# Patient Record
Sex: Female | Born: 1985 | Hispanic: No | Marital: Single | Smoking: Never smoker
Health system: Southern US, Community
[De-identification: ages and names within clinical notes are randomized; demographics above are authoritative.]

## PROBLEM LIST (undated history)

## (undated) DIAGNOSIS — R569 Unspecified convulsions: Secondary | ICD-10-CM

## (undated) HISTORY — DX: Unspecified convulsions: R56.9

---
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
## 2011-11-19 DIAGNOSIS — R339 Retention of urine, unspecified: Secondary | ICD-10-CM | POA: Insufficient documentation

## 2016-07-11 ENCOUNTER — Encounter (INDEPENDENT_AMBULATORY_CARE_PROVIDER_SITE_OTHER): Payer: Self-pay

## 2016-07-11 DIAGNOSIS — M722 Plantar fascial fibromatosis: Secondary | ICD-10-CM | POA: Insufficient documentation

## 2016-07-11 DIAGNOSIS — N319 Neuromuscular dysfunction of bladder, unspecified: Secondary | ICD-10-CM | POA: Insufficient documentation

## 2016-09-16 ENCOUNTER — Other Ambulatory Visit (INDEPENDENT_AMBULATORY_CARE_PROVIDER_SITE_OTHER): Payer: Self-pay | Admitting: Sports Medicine

## 2016-09-16 NOTE — Telephone Encounter (Signed)
Rx was called in to pharmacy. 

## 2016-09-16 NOTE — Telephone Encounter (Signed)
Please see refill request.

## 2016-09-28 ENCOUNTER — Ambulatory Visit (INDEPENDENT_AMBULATORY_CARE_PROVIDER_SITE_OTHER): Payer: Self-pay | Admitting: Sports Medicine

## 2016-10-04 ENCOUNTER — Other Ambulatory Visit: Payer: Self-pay | Admitting: Sports Medicine

## 2016-10-05 ENCOUNTER — Telehealth (INDEPENDENT_AMBULATORY_CARE_PROVIDER_SITE_OTHER): Payer: Self-pay | Admitting: Sports Medicine

## 2016-10-05 NOTE — Telephone Encounter (Signed)
Rx refill request

## 2016-10-05 NOTE — Telephone Encounter (Signed)
[place]

## 2016-10-05 NOTE — Telephone Encounter (Signed)
Pt called for refill of tramadol. Pt callback is [phone].

## 2016-10-05 NOTE — Telephone Encounter (Signed)
Patient would a Rx refill for Tramadol?

## 2016-10-05 NOTE — Telephone Encounter (Signed)
Please call into pharmacy

## 2016-10-06 ENCOUNTER — Telehealth (INDEPENDENT_AMBULATORY_CARE_PROVIDER_SITE_OTHER): Payer: Self-pay | Admitting: Sports Medicine

## 2016-10-06 NOTE — Telephone Encounter (Signed)
Patient Rx for tramadol was called into [facility] on [address] [place], [place] per patient request due to [facility] being down.

## 2016-10-06 NOTE — Telephone Encounter (Signed)
Pt. Called stating [facility] is [facility], and [facility] to [facility] [address] [place].

## 2016-10-06 NOTE — Telephone Encounter (Signed)
Patient advised  she called the pharmacy and her Rx is not there yet. Patient asked if she can get the Rx filled earlier. Patient said the difference is the Rx is it will be 7 days early.  The number to contact her is [phone]

## 2016-10-07 ENCOUNTER — Telehealth (INDEPENDENT_AMBULATORY_CARE_PROVIDER_SITE_OTHER): Payer: Self-pay | Admitting: Sports Medicine

## 2016-10-07 NOTE — Telephone Encounter (Signed)
Patient called asked if the direction on the medication can be changed so that the Rx can be filled. The Rx is early so the pharmacy will not fill it.   Patient asked for a call back as soon as possible. The pharmacy said the insurance will pay for the prescription if the direction is changed so that she can pick Rx up early.  The number to contact her is [phone]

## 2016-10-07 NOTE — Telephone Encounter (Signed)
Rx was called into pharmacy. Patient  Stated to call [facility] due to [facility] system being down.

## 2016-10-07 NOTE — Telephone Encounter (Signed)
Patient no showed her last appointment. Unfortunately she is maxed out on tramadol dosing. I do not prescribe it more often than every 6 hours. If she has run out early unfortunately there is no way to change the prescription to higher dose at this time.

## 2016-10-07 NOTE — Telephone Encounter (Signed)
See message below concerning Rx direction's. Please Advise. Thank You

## 2016-10-08 NOTE — Telephone Encounter (Signed)
Talked with patient and advised her of Dr. Berline Choughigby message.  Patient stated that she has an appointment  Scheduled for next week.

## 2016-10-12 ENCOUNTER — Ambulatory Visit (INDEPENDENT_AMBULATORY_CARE_PROVIDER_SITE_OTHER): Payer: Self-pay | Admitting: Sports Medicine

## 2016-10-14 ENCOUNTER — Encounter (INDEPENDENT_AMBULATORY_CARE_PROVIDER_SITE_OTHER): Payer: Self-pay | Admitting: Sports Medicine

## 2016-10-14 ENCOUNTER — Ambulatory Visit (INDEPENDENT_AMBULATORY_CARE_PROVIDER_SITE_OTHER): Payer: Self-pay

## 2016-10-14 ENCOUNTER — Ambulatory Visit (INDEPENDENT_AMBULATORY_CARE_PROVIDER_SITE_OTHER): Payer: Self-pay | Admitting: Sports Medicine

## 2016-10-14 VITALS — BP 106/70 | HR 100 | Ht 65.0 in | Wt 230.0 lb

## 2016-10-14 DIAGNOSIS — M722 Plantar fascial fibromatosis: Secondary | ICD-10-CM

## 2016-10-14 DIAGNOSIS — N319 Neuromuscular dysfunction of bladder, unspecified: Secondary | ICD-10-CM

## 2016-10-14 DIAGNOSIS — M79605 Pain in left leg: Secondary | ICD-10-CM

## 2016-10-14 NOTE — Progress Notes (Signed)
Mary Mccullough - 30 y.o. female MRN [id number]  Date of birth: 09/29/1986  Office Visit Note: Visit Date: 10/14/2016 PCP: Delorse LekBlair, Diane W, FNP Referred by: No ref. provider found  Subjective: Chief Complaint  Patient presents with  . Left Foot - Follow-up  . Follow-up    Patient states left foot is about the same.  Having trouble with left foot at night time.   HPI: Persistent left-sided foot pain that is essentially unchanged although she does seem to ambulate more comfortably at this time. Swelling is less pronounced than in the past but persistently bothersome especially while ambulating. She denies any pain in the posterior calf or focally over the calcaneus. No significant back pain. The pain does continue to keep her awake at night. She does have chronic underlying urinary issues including an indwelling bladder stimulator.  Additionally she reports that she has had issues with seizure-like activity that has worsened over the past several years & she is working on trying to get scheduled with neurology. She reports absent-like episodes. One of these has occurred while driving.. ROS: She denies any fevers chills recent unexpected weight gain or weight loss.  Some nighttime disturbances due to this issue. Otherwise per HPI.   Clinical History: No specialty comments available.  She reports that she has never smoked. She has never used smokeless tobacco.  No results for input(s): HGBA1C, LABURIC in the last 8760 hours.  Assessment & Plan: Visit Diagnoses:  1. Pain in left leg   2. Plantar fasciitis   3. Neurogenic dysfunction of the urinary bladder    Plan: Given the persistent unrelenting pain that is actually worsened following plantar fascial injection I am concerned that there may be a central process which I suspect is related to her underlying bladder dysfunction. X-rays of her back today do appear to be normal. At this time I recommend a referral to neurology for consideration of  lower extremity nerve conduction studies as well as evaluation for's seizure-like activity. Ultimately she cannot have an MRI due to the indwelling bladder stimulator.  The tramadol that she has been taking has been providing moderate relief & she denies any associated increases in the seizure-like activity since starting this medicine but I did recommend trying to discontinue this due to the risk of lowering her seizure threshold. Also consider Zofran contribution to episodes. >50% of this 25 minute visit spent in direct patient counseling and/or coordination of care. Discussion was focused on education regarding the in discussing the pathoetiology and anticipated clinical course of the above condition.  Follow-up: We'll plan to follow up with her if any lack of improvement from neurology. Overall her plantar fascia & Achilles are less likely to be causing this discomfort   Meds: No orders of the defined types were placed in this encounter.  Orders:  Orders Placed This Encounter  Procedures  . XR Lumbar Spine 2-3 Views  . Ambulatory referral to Neurology   Procedures: No notes on file   Objective:  VS:  HT:5\' 5"  (165.1 cm)   WT:230 lb (104.3 kg)  BMI:38.4    BP:106/70  HR:100bpm  TEMP: ( )  RESP:  Physical Exam: Adult female. In no acute distress. Alert and appropriate. Left foot: Minimal pain with straight leg raise. Circumference symmetric today with 1+ pitting edema in the left & trace in the right. DP & PT pulses are 1+/4 & are slightly diminished on the left compared to right. Sensation in the left lower extremity is  altered compared to the right in a nondermatomal distribution. Babinski is equivocal on the left & upward going on the right right. DTRs absent bilaterally & symmetrically. Imaging: Xr Lumbar Spine 2-3 Views  Result Date: 10/18/2016 Findings: 2V lumbar spine. Overall good alignment. No scoliosis. Bladder stimulator in place. Somewhat exaggerated lumbar lordosis. Minimal  osteophytic spurring with well maintained joint space throughout the lumbar spine. Mild facet arthrosis. Impression: Normal x-ray    Past Medical/Family/Surgical/Social History: Medications & Allergies reviewed per EMR Patient Active Problem List   Diagnosis Date Noted  . Neurogenic dysfunction of the urinary bladder 07/11/2016  . Plantar fasciitis 07/11/2016  . Urinary retention with incomplete bladder emptying 11/19/2011   No past medical history on file. Family History  Problem Relation Age of Onset  . Family history unknown: Yes   No past surgical history on file. Social History   Occupational History  . Not on file.   Social History Main Topics  . Smoking status: Never Smoker  . Smokeless tobacco: Never Used  . Alcohol use Not on file  . Drug use: Unknown  . Sexual activity: Not on file

## 2016-10-14 NOTE — Patient Instructions (Addendum)
Sleep is an integral part of our bodies ability to recover from our daily activities and is key to making you body perform at its maximal potential.  Establishing and maintaining a healthy sleep pattern can not only make you feel better it can help many chronic illnesses including high blood pressure, high cholesterol, obesity, chronic pain syndromes, and many others.  Some key things to remember regarding sleep are:  Establish a consistent nightly routine that you do each night before bed.  Avoid caffeine, tobacco and alcohol as all of these drugs will cause sleep disturbances.    Establish an exercise routine.  This can be as simple as walking, dancing or what every you find gets your heart rate elevated to the point you can speak in only 3-4 word sentences.  Exercise will help make falling and staying asleep easier.    If you have difficulty with sleep, reserve the bedroom for sleeping; do not watch TV, read, eat or exercise in your bed room.      - Watching TV in bed can trick your brain into thinking it is day time and will reset your internal clock.  If you are going to watch TV before bed do so outside of the bedroom.  Although it is best to avoid screens for the hour prior to bed that is difficult to do in our current technology driven society - at the very least it is imperative that you remove TV from the bed room.      - If you have a hard time falling asleep avoid showering and exercising prior to bed.      I am transferring practices as of January 1st  to [facility] at [facility].  This is a great opportunity & I am saddened to be leaving [facility] however & excited for new opportunities. I will continue to be seeing patients at [facility] through the end of December. I am happy to see you at the new location but also am confident that you are in great hands with the excellent providers here at [facility].  We are not currently  scheduling patients at the new location at this time but if you look on [facility] website a contact information should be available there closer to January. Additionally [email or web address] will have information when it becomes available.    The telephone number will be [phone]  - Nobody will be answering this phone number until closer to January.

## 2016-10-19 ENCOUNTER — Other Ambulatory Visit: Payer: Self-pay | Admitting: *Deleted

## 2016-10-19 DIAGNOSIS — M79605 Pain in left leg: Secondary | ICD-10-CM

## 2016-10-27 ENCOUNTER — Encounter: Payer: Self-pay | Admitting: Neurology

## 2016-11-04 ENCOUNTER — Other Ambulatory Visit (INDEPENDENT_AMBULATORY_CARE_PROVIDER_SITE_OTHER): Payer: Self-pay | Admitting: Sports Medicine

## 2016-11-04 NOTE — Telephone Encounter (Signed)
Rx refill request

## 2016-11-04 NOTE — Telephone Encounter (Signed)
Pt had refill on 10/10/16 with 1 additional refill.  She should call the pharmacy to have this filled.

## 2016-11-05 NOTE — Telephone Encounter (Signed)
Talked with patient and she stated to ignore Rx refill [facility].

## 2016-11-10 ENCOUNTER — Ambulatory Visit (INDEPENDENT_AMBULATORY_CARE_PROVIDER_SITE_OTHER): Payer: BLUE CROSS/BLUE SHIELD | Admitting: Neurology

## 2016-11-10 DIAGNOSIS — M79605 Pain in left leg: Secondary | ICD-10-CM

## 2016-11-10 NOTE — Procedures (Signed)
[facility]  [address], [address]  [place], [place] [address] Tel: [phone] Fax:  [phone] Test Date:  11/10/2016  Patient: Mary ReddishMegan Bilger DOB: 04/23/1986 Physician: Nita Sickleonika Patel, DO  Sex: Female Height: 5\' 5"  Ref Phys: Russella DarMicheal D. Berline Choughigby, MD  ID#: [id number] Temp: 32.6C Technician: Judie PetitM. Dean   Patient Complaints: This is a 30 year old female referred for evaluation of left foot numbness, pain, and swelling.   NCV & EMG Findings: Extensive electrodiagnostic testing of the left lower extremity shows:  1. Left sural and superficial peroneal sensory responses are within normal limits. 2. Left peroneal and tibial motor responses are within normal limits. 3. There is no evidence of active or chronic motor axon loss changes affecting any of the tested muscles. Motor unit configuration and recruitment pattern is within normal limits.  Impression: This is a normal study of the left lower extremity.   ___________________________ Nita Sickleonika Patel, DO    Nerve Conduction Studies Anti Sensory Summary Table   Site NR Peak (ms) Norm Peak (ms) P-T Amp (V) Norm P-T Amp  Left Sup Peroneal Anti Sensory (Ant Lat Mall)  12 cm    3.1 <4.4 6.7 >6  Left Sural Anti Sensory (Lat Mall)  Calf    2.8 <4.4 7.7 >6   Motor Summary Table   Site NR Onset (ms) Norm Onset (ms) O-P Amp (mV) Norm O-P Amp Site1 Site2 Delta-0 (ms) Dist (cm) Vel (m/s) Norm Vel (m/s)  Left Peroneal Motor (Ext Dig Brev)  Ankle    3.4 <5.5 3.0 >3 B Fib Ankle 7.5 33.0 44 >41  B Fib    10.9  1.9  Poplt B Fib 1.8 10.0 56 >41  Poplt    12.7  1.9         Left Tibial Motor (Abd Hall Brev)  33C  Ankle    4.9 <5.8 8.8 >8 Knee Ankle 6.7 36.0 54 >41  Knee    11.6  8.5          EMG   Side Muscle Ins Act Fibs Psw Fasc Number Recrt Dur Dur. Amp Amp. Poly Poly. Comment  Left AntTibialis Nml Nml Nml Nml Nml Nml Nml Nml Nml Nml Nml Nml N/A  Left Gastroc Nml Nml Nml Nml Nml Nml Nml Nml Nml Nml Nml Nml N/A  Left Flex Dig  Long Nml Nml Nml Nml Nml Nml Nml Nml Nml Nml Nml Nml N/A  Left RectFemoris Nml Nml Nml Nml Nml Nml Nml Nml Nml Nml Nml Nml N/A  Left GluteusMed Nml Nml Nml Nml Nml Nml Nml Nml Nml Nml Nml Nml N/A  Left BicepsFemS Nml Nml Nml Nml Nml Nml Nml Nml Nml Nml Nml Nml N/A      Waveforms:

## 2016-11-13 ENCOUNTER — Telehealth (INDEPENDENT_AMBULATORY_CARE_PROVIDER_SITE_OTHER): Payer: Self-pay

## 2016-11-13 NOTE — Telephone Encounter (Signed)
The referral was intended for neurology to evaluate and treat in addition to the EMGs.  I would like for her to follow-up with Dr. Allena KatzPatel for a consultation especially in the setting of increasing pain following the procedure (NCS/EMGs).  Unfortunately I do not have any additional treatments to offer her from a musculoskeletal standpoint and would like for neurology to weigh in on what may be contributing to her discomfort.    Please ensure that neurology follow-up has been arranged and inform patient of the above.

## 2016-11-13 NOTE — Telephone Encounter (Signed)
Talked with patient and advised her of message concerning EMG/NCV results and she stated that was her only appointment for the Neurologist.  Patient would still like to know what the next step will be, stated that her pain is worse since she had the nerve conduction study done. Please Advise.

## 2016-11-13 NOTE — Telephone Encounter (Signed)
Talked with patient and advised her of message concerning a follow-up appointment with Dr. Allena KatzPatel for a consultation.

## 2016-11-13 NOTE — Telephone Encounter (Signed)
-----   Message from Andrena MewsMichael D Rigby, DO sent at 11/12/2016  1:51 PM EST ----- Patient was also status post have an evaluation by neurology.  It appears this was scheduled previously the patient either canceled or no showed.  Please follow-up on whether or not she is planning on having this rescheduled.  Otherwise her nerve conduction studies are normal and reassuring however do not help explain the underlying symptoms that she is having.

## 2016-11-18 NOTE — Telephone Encounter (Signed)
ERROR

## 2016-11-27 ENCOUNTER — Other Ambulatory Visit (INDEPENDENT_AMBULATORY_CARE_PROVIDER_SITE_OTHER): Payer: Self-pay

## 2016-11-27 MED ORDER — GABAPENTIN 300 MG PO CAPS: 300.0000 mg | ORAL_CAPSULE | Freq: Three times a day (TID) | ORAL | 3 refills | Status: DC

## 2017-01-13 ENCOUNTER — Telehealth: Payer: Self-pay | Admitting: Family Medicine

## 2017-01-13 NOTE — Telephone Encounter (Signed)
Patient of Dr. Janeece Riggersigby's who has not seen him at [facility] calling about a refill for Ultram (tramedol).  I scheduled her for an appt March 7th and advised her that I'm not sure if he will be able to send a refill to her pharmacy.  Thank you,  -LL

## 2017-01-15 ENCOUNTER — Telehealth: Payer: Self-pay | Admitting: Family Medicine

## 2017-01-15 NOTE — Telephone Encounter (Signed)
[facility] [place] [facility] 12/[facility]/16 #120. I will need to forward request to Dr. Berline Choughigby since it has not been 30 days since last refill.

## 2017-01-15 NOTE — Telephone Encounter (Signed)
Patient of Dr. Janeece Riggersigby's who has only seen him at [facility] is requesting a refill on her medication.  She is scheduled to see him on March 7th, but has been persistently calling about a refill.  I sent the original note earlier in the week but can not seem to find the encounter and have not heard back from anyone, so I'm not sure if I forgot to route someone.   Please let me know if Berline ChoughRigby can assist her. The medication is Ultram (tramedol).  Thank you,  -LL

## 2017-01-18 NOTE — Telephone Encounter (Signed)
Last fill was on 12/29/2016 for 120 tablets.  We will not be refilling this prior to 12/30/2016 when her appointment is and this will need to be addressed during that visit.  No refills outside of a visit will be provided.

## 2017-01-18 NOTE — Telephone Encounter (Signed)
Called pt and advised. She is OK with waiting and discussing this with Dr Berline Choughigby at her upcoming appointment.

## 2017-01-26 ENCOUNTER — Other Ambulatory Visit: Payer: Self-pay | Admitting: Sports Medicine

## 2017-01-27 ENCOUNTER — Ambulatory Visit (INDEPENDENT_AMBULATORY_CARE_PROVIDER_SITE_OTHER): Payer: BLUE CROSS/BLUE SHIELD | Admitting: Sports Medicine

## 2017-01-27 ENCOUNTER — Other Ambulatory Visit: Payer: Self-pay

## 2017-01-27 ENCOUNTER — Encounter: Payer: Self-pay | Admitting: Sports Medicine

## 2017-01-27 VITALS — BP 114/82 | HR 111 | Ht 65.0 in | Wt 255.6 lb

## 2017-01-27 DIAGNOSIS — M722 Plantar fascial fibromatosis: Secondary | ICD-10-CM

## 2017-01-27 DIAGNOSIS — G894 Chronic pain syndrome: Secondary | ICD-10-CM | POA: Insufficient documentation

## 2017-01-27 DIAGNOSIS — G90522 Complex regional pain syndrome I of left lower limb: Secondary | ICD-10-CM | POA: Diagnosis not present

## 2017-01-27 DIAGNOSIS — N319 Neuromuscular dysfunction of bladder, unspecified: Secondary | ICD-10-CM | POA: Diagnosis not present

## 2017-01-27 MED ORDER — TRAMADOL HCL 50 MG PO TABS: 50.0000 mg | ORAL_TABLET | Freq: Four times a day (QID) | ORAL | 2 refills | Status: DC | PRN

## 2017-01-27 MED ORDER — DOXEPIN HCL 75 MG PO CAPS: 75.0000 mg | ORAL_CAPSULE | Freq: Every day | ORAL | 1 refills | Status: DC

## 2017-01-27 MED ORDER — GABAPENTIN 300 MG PO CAPS: 900.0000 mg | ORAL_CAPSULE | Freq: Three times a day (TID) | ORAL | 5 refills | Status: DC

## 2017-01-27 NOTE — Assessment & Plan Note (Signed)
Symptoms may reflect underlying complex regional pain syndrome especially with exacerbation from provoking procedures including nerve conduction studies and injection.  Will increase her doxepin as well as gabapentin.  She has been intolerant to Cymbalta and Lyrica.

## 2017-01-27 NOTE — Patient Instructions (Signed)
Look into your insurance covering you for orthotics.  The code is L3030 and there will be 2 of these units.  I believe you may have complex regional pain syndrome also known as reflex sympathetic dystrophy.  We are going to increase your doxepin and get her gabapentin back to 900 mg 3 times daily.  I have refilled your tramadol.   Complex Regional Pain Syndrome Complex regional pain syndrome (CRPS) is a nerve disorder that causes long-lasting (chronic) pain, usually in a hand, arm, leg, or foot. CRPS usually follows an injury or trauma, such as a fracture or sprain. There are two types of CRPS:  Type 1. This type occurs after an injury or trauma with no known damage to a nerve.  Type 2. This type occurs after injury or trauma damages a nerve. There are three stages of the condition:  Stage 1. This stage, called the acute stage, may last for three months.  Stage 2. This stage, called the dystrophic stage, may last for three to 12 months.  Stage 3. This stage, called the atrophic stage, may start after one year. CRPS ranges from mild to severe. For most people CRPS is mild and recovery happens over time. For others, CRPS lasts a very long time and is debilitating. What are the causes? The exact cause of CRPS is not known. What increases the risk? You may be at increased risk if:  You are a woman.  You are approximately 29 years of age.  You have any of the following:  A family history of CRPS.  An injury or surgery.  An infection.  Cancer.  Neck problems.  A stroke.  A heart attack.  Asthma. What are the signs or symptoms? Signs and symptoms in the affected limb are different for each stage. Signs and symptoms of stage 1 include:  Burning pain.  A pins and needles sensation.  Extremely sensitive skin.  Swelling.  Joint stiffness.  Warmth and redness.  Excessive sweating.  Hair and nail growth that is faster than normal. Signs and symptoms of stage 2  include:  Spreading of pain to the whole limb.  Increased skin sensitivity.  Increased swelling and stiffness.  Coolness of the skin.  Blue discoloration of skin.  Loss of skin wrinkles.  Brittle fingernails. Signs and symptoms of stage 3 include:  Pain that spreads to other areas of the body but becomes less severe.  More stiffness, leading to loss of motion.  Skin that is pale, dry, shiny, and tightly stretched. How is this diagnosed? There is no test to diagnose CRPS. Your health care provider will make a diagnosis based on your signs and symptoms and a physical exam. The exam may include tests to rule out other possible causes of your symptoms. Sometimes imaging tests are done, such as an MRI or bone scan. These tests check for bone changes that might indicate CRPS. How is this treated? Early treatment may prevent CRPS from advancing past stage 1. There is no one treatment that works for everyone. Treatment options may include:  Medicines, such as:  Nonsteroidal-anti-inflammatory drugs (NSAIDS).  Steroids.  Blood pressure drugs.  Antidepressants.  Anti-seizure drugs.  Pain relievers.  Exercise.  Occupational and physical therapy.  Biofeedback.  Mental health counseling.  Numbing injections.  Spinal surgery to implant a spinal cord stimulator or a pain pump. Follow these instructions at home:  Take medicines only as directed by your health care provider.  Follow an exercise program as directed by your  health care provider.  Maintain a healthy weight.  Keep all follow-up visits as directed by your health care provider. This is important. Contact a health care provider if:  Your symptoms change.  Your symptoms get worse.  You develop anxiety or depression. This information is not intended to replace advice given to you by your health care provider. Make sure you discuss any questions you have with your health care provider. Document Released:  10/30/2002 Document Revised: 04/16/2016 Document Reviewed: 08/06/2014 Elsevier Interactive Patient Education  2017 [employer].

## 2017-01-27 NOTE — Progress Notes (Signed)
Mary Mccullough - 31 y.o. female MRN [id number]  Date of birth: 11/04/1986  Office Visit Note: Visit Date: 01/27/2017 PCP: Delorse LekBlair, Diane W, FNP Referred by: Delorse LekBlair, Diane W, FNP  Subjective: Chief Complaint  Patient presents with  . Follow-up    Pt here to f/u on pain in left leg.    HPI: Follow-up of left foot and leg pain.  She is continued to have intermittent swelling and pain that is out of proportion.  She recently underwent a nerve conduction study and reported severe flare in her pain for 4-5 days following this.  Nerve conduction studies were unremarkable.  She has continued on gabapentin, tramadol and doxepin nightly with only mild improvement in her symptoms.  She has been using compression sleeve but continues to have pain with any type of activity.  When she gets home from work she medially puts her feet up due to the discomfort and has been very sedentary due to this issue.  Initial onset of this was approximately 2 years ago and failed conservative management initially with prolonged immobilization injections.  She in fact reports worsening symptoms following both of the invasive procedures that she has had with me. ROS: She is underlying history of neurogenic bladder and has a indwelling bladder stimulator. otherwise per HPI.  Objective:  VS:  HT:5\' 5"  (165.1 cm)   WT:255 lb 9.6 oz (115.9 kg)  BMI:42.6    BP:114/82  HR:(!) 111bpm  TEMP: ( )  RESP:98 % Physical Exam: GENERAL:  WDWN, NAD, Non-toxic appearing PSYCH:  Alert & appropriately interactive  Not depressed or anxious appearing LOWER EXTREMITIES:  No significant rashes/lesions/ulcerations overlying the legs.  1+ pitting edema on the left pretibial region.  Trace pitting edema on the right.  . No clubbing or cyanosis.  DP PT pulses are faint.  Capillary refill is 4-5 seconds on the left foot and normal on the right.  Generalized hyperesthesia in the entire left foot and ankle region. FOOT & ANKLE:    Overall foot and ankle are well aligned, no significant deformity.   Marked TTP over the calcaneus as well as medial ankle joint.  Mild pain with plantar fascial stretch.  Pain seems to be worse with deep palpation of the plantar surface mid substance.  Ankle dorsiflexion to 100 on the left.       Imaging & Procedures: No results found.  NCS reviewed  Assessment & Plan: Problem List Items Addressed This Visit    Neurogenic dysfunction of the urinary bladder   Plantar fasciitis - Primary    Patient has had ongoing issues with plantar fascial pain it is still tender to palpation at the insertion of the plantar fascia however most recent musculoskeletal ultrasound was less impressive and actually revealed more of a deep fluid collection.  She is unable to have an MRI obtained due to the bladder stimulator.  Her ankle dysfunction is significantly improved she has tried appropriate therapies.  I am concerned this may reflect more of an underlying RSD/CRPS problem at this time.  She may benefit from custom cushion orthotics and she will look into this and call to schedule FASTEC orthotics at her convenience if she wishes.  Otherwise she should titrate medications as prescribed and follow-up in 3 months.      Complex regional pain syndrome    Symptoms may reflect underlying complex regional pain syndrome especially with exacerbation from provoking procedures including nerve conduction studies and injection.  Will increase her doxepin as well  as gabapentin.  She has been intolerant to Cymbalta and Lyrica.      Relevant Medications   gabapentin (NEURONTIN) 300 MG capsule   doxepin (SINEQUAN) 75 MG capsule      Follow-up: Return in about 3 months (around 04/29/2017) for Call for custom orthotics if you would like them..   Past Medical/Family/Surgical/Social History: Medications & Allergies reviewed per EMR Patient Active Problem List   Diagnosis Date Noted  . Complex regional pain  syndrome 01/27/2017  . Neurogenic dysfunction of the urinary bladder 07/11/2016  . Plantar fasciitis 07/11/2016  . Urinary retention with incomplete bladder emptying 11/19/2011   History reviewed. No pertinent past medical history. Family History  Problem Relation Age of Onset  . Family history unknown: Yes   History reviewed. No pertinent surgical history. Social History   Occupational History  . Not on file.   Social History Main Topics  . Smoking status: Never Smoker  . Smokeless tobacco: Never Used  . Alcohol use Yes     Comment: very rarely  . Drug use: Unknown  . Sexual activity: Not on file

## 2017-01-27 NOTE — Assessment & Plan Note (Signed)
Patient has had ongoing issues with plantar fascial pain it is still tender to palpation at the insertion of the plantar fascia however most recent musculoskeletal ultrasound was less impressive and actually revealed more of a deep fluid collection.  She is unable to have an MRI obtained due to the bladder stimulator.  Her ankle dysfunction is significantly improved she has tried appropriate therapies.  I am concerned this may reflect more of an underlying RSD/CRPS problem at this time.  She may benefit from custom cushion orthotics and she will look into this and call to schedule FASTEC orthotics at her convenience if she wishes.  Otherwise she should titrate medications as prescribed and follow-up in 3 months.

## 2017-02-16 ENCOUNTER — Telehealth: Payer: Self-pay | Admitting: Sports Medicine

## 2017-02-16 ENCOUNTER — Other Ambulatory Visit: Payer: Self-pay

## 2017-02-16 MED ORDER — DOXEPIN HCL 25 MG PO CAPS: 25.0000 mg | ORAL_CAPSULE | Freq: Every day | ORAL | 0 refills | Status: DC

## 2017-02-16 NOTE — Telephone Encounter (Signed)
Pt was last seen on 01/27/2017, her gabapentin (NEURONTIN) 300 MG capsule doxepin (SINEQUAN) 75 MG capsule was increased. Please advise on the next step. Thanks.

## 2017-02-16 NOTE — Telephone Encounter (Signed)
Spoke with patient, she is aware of your annotations below. Pt was increased from 25mg  1qhs to 75mg  1qhs of doxepin. Since the increase pt reports increased fatigue and feeling "loopy". She is certain its from that specific medication and not the others. Until the imaging is finalized would you recommend decreasing back to 25mg  of the doxepin or D/C all together? Pt is aware that [facility] unless you have other recommendations. She is also aware that we will be in touch about the imaging.

## 2017-02-16 NOTE — Telephone Encounter (Signed)
Spoke with radiology regarding possibility of MRI in the setting of pallor stimulator based on the [place] [employer] study from December 2017 looking at the safety efficacy of MRI in patients with pacemakers.  Given there were no adverse effects likely even with Legacy devices MRI is a safe option.  Dr. Pecolia AdesGallerani is looking into this from their protocol standpoint and will reach back out to me.  If MRI is not an option we will consider a bone scan on her foot and ankle for further evaluation of CRPS/RSD

## 2017-02-16 NOTE — Telephone Encounter (Signed)
Patient called to advise that she is experiencing much more pain than normal. She is asking if there is anything else that she can to do help with the pain.

## 2017-02-20 ENCOUNTER — Other Ambulatory Visit: Payer: Self-pay | Admitting: Sports Medicine

## 2017-03-15 ENCOUNTER — Other Ambulatory Visit: Payer: Self-pay | Admitting: Sports Medicine

## 2017-03-17 ENCOUNTER — Other Ambulatory Visit: Payer: Self-pay

## 2017-03-17 ENCOUNTER — Other Ambulatory Visit: Payer: Self-pay | Admitting: Sports Medicine

## 2017-03-17 ENCOUNTER — Telehealth: Payer: Self-pay | Admitting: Sports Medicine

## 2017-03-17 DIAGNOSIS — M8430XA Stress fracture, unspecified site, initial encounter for fracture: Secondary | ICD-10-CM

## 2017-03-17 NOTE — Telephone Encounter (Signed)
Spoke with patient and she says that she is taking Gabapentin TID, Tramadol TID, and Doxepin qhs. She says that on a regular basis the regimen works great but she has days that the pain is 10/10. She is on her feet all day and there are some days that she would like to cut her foot off. She wants to know if there is anything she can take in emergencies for the pain. She cannot take NSAID's because they make her face swell. She has tried taking Tylenol and that doesn't help. She says that over the last 3 days with the cold and rain her pain has been worse. She says today her foot hurts even when it is dangling. She makes sure to rest and elevate her feet while she is not at work. She wants to know if there is anything that can be called in for her to take in case of an emergency when her pain is severe.

## 2017-03-17 NOTE — Telephone Encounter (Signed)
This will need to be discussed with Dr. Berline Chough when he returns regarding insurance authorization.

## 2017-03-17 NOTE — Telephone Encounter (Signed)
Unfortunately there isn't anything else that I will be prescribing medication wise. We can refer her to pain management if she is interested. Otherwise the next the next test to order is a Bone Scan of her left lower extremity (nuclear medicine scan) looking to confirm CRPS (Complex regional pain syndrome).   Please ask the patient if she would like to proceed with this test or be referred to pain management.   Thanks  <B R>

## 2017-03-17 NOTE — Telephone Encounter (Signed)
Called [phone] and [facility] for pt to call the office.

## 2017-03-17 NOTE — Telephone Encounter (Signed)
Patient called in reference to medications she can take along with the RX meds she is already taking. Patient mentioned Rx meds not working occasionally. Please call and advise.

## 2017-03-17 NOTE — Telephone Encounter (Signed)
LMOM for pt to call the office °

## 2017-03-17 NOTE — Telephone Encounter (Signed)
Spoke with patient. She says that she would rather have the bone scan done then be referred to another provider because she wants to stick with Dr. Berline Chough because he has been able to help her more than anyone else.

## 2017-03-22 NOTE — Telephone Encounter (Signed)
FYI-please see me for addition information that Brandy left on a sticky note.

## 2017-03-22 NOTE — Telephone Encounter (Signed)
Bone scan would help evaluate for potential stress fracture which is a possible etiology given the symptomatology.  Prior x-rays have been unrevealing but she is unable to undergo MRI due to the bladder stimulator she has for her neurogenic bladder. Please proceed with 3 Phase Bone scan to eval for possible stress fracture vs CRPS

## 2017-03-22 NOTE — Telephone Encounter (Signed)
[facility] She picked #120 up today. Pt would like to know if you will be able to send in a refill near the end of May.

## 2017-03-22 NOTE — Addendum Note (Signed)
Addended by: Tempie Hoist on: 03/22/2017 01:18 PM   Modules accepted: Orders

## 2017-03-22 NOTE — Telephone Encounter (Signed)
We will need to follow up with her after the Bone Scan and can discuss refills at that time

## 2017-03-23 NOTE — Telephone Encounter (Signed)
Pt is aware.  

## 2017-03-25 ENCOUNTER — Encounter (HOSPITAL_COMMUNITY): Payer: BLUE CROSS/BLUE SHIELD

## 2017-04-09 ENCOUNTER — Ambulatory Visit (HOSPITAL_COMMUNITY): Payer: BLUE CROSS/BLUE SHIELD

## 2017-04-09 ENCOUNTER — Encounter (HOSPITAL_COMMUNITY): Payer: BLUE CROSS/BLUE SHIELD

## 2017-04-14 ENCOUNTER — Telehealth: Payer: Self-pay | Admitting: Sports Medicine

## 2017-04-14 NOTE — Telephone Encounter (Signed)
**  Remind patient they can make refill requests via MyChart**  Medication refill request (Name & Dosage): traMADol (ULTRAM) 50 MG tablet [[id number]][[id number]]     Preferred pharmacy (Name & Address): [id number] [place] - [address] [facility] [address] [phone] (Phone) [phone] (Fax)       Other comments (if applicable):

## 2017-04-14 NOTE — Telephone Encounter (Signed)
Last refill 03/22/17 #120, bone scan scheduled for 04/23/17.

## 2017-04-15 ENCOUNTER — Other Ambulatory Visit: Payer: Self-pay

## 2017-04-15 MED ORDER — TRAMADOL HCL 50 MG PO TABS: 50.0000 mg | ORAL_TABLET | Freq: Four times a day (QID) | ORAL | 0 refills | Status: DC | PRN

## 2017-04-15 NOTE — Telephone Encounter (Signed)
Okay to fill Tramadol 50mg  po q 6 hours. #120 no refills

## 2017-04-15 NOTE — Telephone Encounter (Signed)
[facility] to [facility], left VM on Rx line.

## 2017-04-23 ENCOUNTER — Ambulatory Visit (HOSPITAL_COMMUNITY): Payer: BLUE CROSS/BLUE SHIELD

## 2017-04-23 ENCOUNTER — Encounter (HOSPITAL_COMMUNITY): Payer: BLUE CROSS/BLUE SHIELD

## 2017-04-30 ENCOUNTER — Ambulatory Visit: Payer: BLUE CROSS/BLUE SHIELD | Admitting: Sports Medicine

## 2017-05-10 ENCOUNTER — Telehealth: Payer: Self-pay | Admitting: Sports Medicine

## 2017-05-10 NOTE — Telephone Encounter (Signed)
**  Remind patient they can make refill requests via MyChart**  Medication refill request (Name & Dosage): gabapentin (NEURONTIN) 300 MG capsule [[id number]][[id number]]   traMADol (ULTRAM) 50 MG tablet [[id number]][[id number]]    Preferred pharmacy (Name & Address):  [id number] [place] - [address] [facility] [address] [phone] (Phone) [phone])      Other comments (if applicable):   Patient will be rescheduling Bone scan for later on in the summer, she states she is having issues getting time off work.

## 2017-05-10 NOTE — Telephone Encounter (Signed)
Pt was previously advised no additional refills until after her bone scan. Will forward to Dr. Berline Choughigby.

## 2017-05-11 NOTE — Telephone Encounter (Signed)
Pt is aware of annotations. Due to work and finances pt is unable to have scan done until July or August. I specifically told her no refills and will sent message to Dr. Berline Choughigby to see if he has any alternation OTC treatments.

## 2017-05-11 NOTE — Telephone Encounter (Signed)
Patient returning phone call. Transferred to [place].

## 2017-05-11 NOTE — Telephone Encounter (Signed)
Left message for pt to call back about medication.  

## 2017-05-11 NOTE — Telephone Encounter (Signed)
As per previously outlined in clearly communicated to the patient no further refills until she is seen in clinic and undergoes the bone scan.

## 2017-05-13 ENCOUNTER — Other Ambulatory Visit: Payer: Self-pay | Admitting: Sports Medicine

## 2017-05-13 NOTE — Telephone Encounter (Signed)
Given her multiple medication intolerances the only over-the-counter option that would be beneficial for her would be Tylenol.  Unfortunately without appropriate evaluation I cannot continue to prescribe medications for her until further diagnostic evaluation is performed and appropriate face-to-face encounter occurs.

## 2017-05-13 NOTE — Telephone Encounter (Signed)
Patient calling to inquire about seeing provider after bone scan tomorrow- I informed her that I was unsure what Dr. Berline Choughigby would want and we would try to touch base first thing in the morning tomorrow.

## 2017-05-13 NOTE — Telephone Encounter (Signed)
Patient called to inform that she is doing the bone scan tomorrow morning.

## 2017-05-13 NOTE — Telephone Encounter (Signed)
Patient called to check the status of the note below. Advised patient that the clinical staff is still waiting on a response from Dr. Berline Choughigby. No further action required until a response from Dr. Berline Choughigby.

## 2017-05-14 ENCOUNTER — Ambulatory Visit (HOSPITAL_COMMUNITY)
Admission: RE | Admit: 2017-05-14 | Discharge: 2017-05-14 | Disposition: A | Discharge status: Home / Self Care | Payer: BLUE CROSS/BLUE SHIELD | Source: Ambulatory Visit | Attending: Sports Medicine | Admitting: Sports Medicine

## 2017-05-14 ENCOUNTER — Other Ambulatory Visit: Payer: Self-pay

## 2017-05-14 ENCOUNTER — Encounter (HOSPITAL_COMMUNITY)
Admission: RE | Admit: 2017-05-14 | Discharge: 2017-05-14 | Disposition: A | Discharge status: Home / Self Care | Payer: BLUE CROSS/BLUE SHIELD | Source: Ambulatory Visit | Attending: Sports Medicine | Admitting: Sports Medicine

## 2017-05-14 DIAGNOSIS — M8430XA Stress fracture, unspecified site, initial encounter for fracture: Secondary | ICD-10-CM | POA: Insufficient documentation

## 2017-05-14 MED ORDER — TRAMADOL HCL 50 MG PO TABS: 50.0000 mg | ORAL_TABLET | Freq: Four times a day (QID) | ORAL | 0 refills | Status: DC | PRN

## 2017-05-14 MED ORDER — TECHNETIUM TC 99M MEDRONATE IV KIT
21.6000 | PACK | Freq: Once | INTRAVENOUS | Status: AC | PRN
  Administered 2017-05-14: 21.6 via INTRAVENOUS

## 2017-05-14 NOTE — Telephone Encounter (Signed)
Patient called to see if she could come see Dr. Berline Choughigby today however, Dr. Berline Choughigby needs her results back first before he can do anything. The patient is out of all medications and will be going out of town this upcoming week. Patient needs to know if there is any way that she can get her medications filled for a week before leaving today for 11 days. Call patient to advise as soon as possible.

## 2017-05-14 NOTE — Telephone Encounter (Signed)
Pt made aware by Darletta MollLumin Lander

## 2017-05-14 NOTE — Telephone Encounter (Signed)
[facility] week supply [facility]. Called pt and left VM advising.

## 2017-05-14 NOTE — Telephone Encounter (Signed)
Advised pt her script was called in. Rescheduled her 3 mos f/u that she cancelled.  Thank you,  -LL

## 2017-06-01 ENCOUNTER — Encounter: Payer: Self-pay | Admitting: Sports Medicine

## 2017-06-01 ENCOUNTER — Ambulatory Visit (INDEPENDENT_AMBULATORY_CARE_PROVIDER_SITE_OTHER): Payer: BLUE CROSS/BLUE SHIELD | Admitting: Sports Medicine

## 2017-06-01 VITALS — BP 100/80 | HR 108 | Ht 65.0 in | Wt 258.8 lb

## 2017-06-01 DIAGNOSIS — M722 Plantar fascial fibromatosis: Secondary | ICD-10-CM | POA: Diagnosis not present

## 2017-06-01 DIAGNOSIS — G90522 Complex regional pain syndrome I of left lower limb: Secondary | ICD-10-CM | POA: Diagnosis not present

## 2017-06-01 DIAGNOSIS — R339 Retention of urine, unspecified: Secondary | ICD-10-CM | POA: Diagnosis not present

## 2017-06-01 DIAGNOSIS — N319 Neuromuscular dysfunction of bladder, unspecified: Secondary | ICD-10-CM

## 2017-06-01 MED ORDER — TRAMADOL HCL 50 MG PO TABS: 50.0000 mg | ORAL_TABLET | Freq: Four times a day (QID) | ORAL | 3 refills | Status: DC | PRN

## 2017-06-01 NOTE — Progress Notes (Signed)
OFFICE VISIT NOTE Mary Mccullough. [name] at [facility] [phone]  [name] - 31 y.o. female MRN [id number]  Date of birth: 08-03-1986  Visit Date: 06/01/2017  PCP: [name], FNP   Referred by: [name], FNP  Clovis Cao, cma acting as scribe for Dr. Berline Chough.  SUBJECTIVE:   Chief Complaint  Patient presents with  . Follow-up  . Review Bone Scan   HPI: As below and per problem based documentation when appropriate.   Mary Mccullough reports no changes in sx since last visit. Pain is mainly in the heel of foot but at times will radiate to the top of foot. Resting does give her relief. She had Bone Scan on 05-14-2017 and would like to discuss results today. She is taking Gabapentin and Doxepin as directed. Takes tramadol 50mg  TID with relief.     Review of Systems  Constitutional: Negative for chills, diaphoresis, fever, malaise/fatigue and weight loss.  HENT: Negative.   Eyes: Negative.   Respiratory: Negative.   Cardiovascular: Negative.   Gastrointestinal: Negative.   Genitourinary: Negative.   Musculoskeletal: Positive for joint pain. Negative for back pain, falls, myalgias and neck pain.  Skin: Negative for itching and rash.  Neurological: Negative.  Negative for weakness.  Endo/Heme/Allergies: Negative for environmental allergies and polydipsia. Does not bruise/bleed easily.  Psychiatric/Behavioral: Negative.     Otherwise per HPI.  HISTORY & PERTINENT PRIOR DATA:  No specialty comments available. She reports that she has never smoked. She has never used smokeless tobacco. No results for input(s): HGBA1C, LABURIC in the last 8760 hours. Medications & Allergies reviewed per EMR Patient Active Problem List   Diagnosis Date Noted  . Complex regional pain syndrome 01/27/2017  . Neurogenic dysfunction of the urinary bladder 07/11/2016  . Plantar fasciitis 07/11/2016  . Urinary retention with incomplete bladder  emptying 11/19/2011   History reviewed. No pertinent past medical history. Family History  Problem Relation Age of Onset  . Family history unknown: Yes   History reviewed. No pertinent surgical history. Social History   Occupational History  . Not on file.   Social History Main Topics  . Smoking status: Never Smoker  . Smokeless tobacco: Never Used  . Alcohol use Yes     Comment: very rarely  . Drug use: Unknown  . Sexual activity: Not on file    OBJECTIVE:  VS:  HT:5\' 5"  (165.1 cm)   WT:258 lb 12.8 oz (117.4 kg)  BMI:43.2    BP:100/80  HR:(!) 108bpm  TEMP: ( )  RESP:97 % EXAM: Findings:  Bilateral lower extremities overall well aligned.  There palpable pulses bilaterally.  She does have some slight dysesthesia within the entire foot although there is no focal pain.  There is no evidence of dystrophic changes at this time she does report a history of heat and cold intolerance.  She also has pictures that show significant signs of dystrophic changes although these have not been appreciated in clinic.  Dorsiflexion to 110.  Ankle testing is normal.  She had does have generalized dysesthesia and increased focal pain over the calcaneus.     Three-phase bone scan reviewed that shows no focal findings.  ASSESSMENT & PLAN:     ICD-10-CM   1. Complex regional pain syndrome type 1 of left lower extremity G90.522   2. Neurogenic dysfunction of the urinary bladder N31.9   3. Plantar fasciitis M72.2   4. Urinary retention with incomplete  bladder emptying R33.9   ================================================================= Complex regional pain syndrome And does continue to be consistent with CRPS although no evidence of this on bone scan.  We will continue with tramadol but will trial off of the gabapentin she does not feel that this is benefiting her.  We will try to increase her doxepin.  Discussed referral to pain management for longer acting Nucynta if she is interested  but she would like to defer this at this time.  She is also not interested in returning to neurology although this was discussed.  Plan to see her back in 3 months for refill on the tramadol.  Urinary retention with incomplete bladder emptying Unable to undergo MRI of the foot.    Plantar fasciitis Symptoms do continue to be consistent with severe plantar fasciitis.   can consider referral for surgical intervention she is not interested at this time.  Given the overall good mobility of her ankle with dorsiflexion to 110 Achilles lengthening procedures likely not indicated although could be considered. ================================================================= Patient Instructions  Try cutting back to gabapentin to just at nighttime for 1 week and then discontinue it altogether.  We will continue with the tramadol and the doxepin at night.  If you would like to look into the longer acting tramadol called Nucynta (Tapendadol) let me know so we can place a referral to pain management. ================================================================= Future Appointments Date Time Provider Department Center  09/03/2017 3:30 PM Andrena Mewsigby, Johan Antonacci D, DO [facility] None    Follow-up: Return in about 3 months (around 09/01/2017).   CMA/ATC served as Neurosurgeonscribe during this visit. History, Physical, and Plan performed by medical provider. Documentation and orders reviewed and attested to.      Gaspar BiddingMichael Janziel Hockett, DO    [facility]

## 2017-06-01 NOTE — Patient Instructions (Signed)
Try cutting back to gabapentin to just at nighttime for 1 week and then discontinue it altogether.  We will continue with the tramadol and the doxepin at night.  If you would like to look into the longer acting tramadol called Nucynta (Tapendadol) let me know so we can place a referral to pain management.

## 2017-06-04 ENCOUNTER — Ambulatory Visit: Payer: BLUE CROSS/BLUE SHIELD | Admitting: Sports Medicine

## 2017-07-03 NOTE — Assessment & Plan Note (Signed)
Unable to undergo MRI of the foot.

## 2017-07-03 NOTE — Assessment & Plan Note (Signed)
Symptoms do continue to be consistent with severe plantar fasciitis.   can consider referral for surgical intervention she is not interested at this time.  Given the overall good mobility of her ankle with dorsiflexion to 110 Achilles lengthening procedures likely not indicated although could be considered.

## 2017-07-03 NOTE — Assessment & Plan Note (Signed)
And does continue to be consistent with CRPS although no evidence of this on bone scan.  We will continue with tramadol but will trial off of the gabapentin she does not feel that this is benefiting her.  We will try to increase her doxepin.  Discussed referral to pain management for longer acting Nucynta if she is interested but she would like to defer this at this time.  She is also not interested in returning to neurology although this was discussed.  Plan to see her back in 3 months for refill on the tramadol.

## 2017-08-16 ENCOUNTER — Other Ambulatory Visit: Payer: Self-pay | Admitting: Sports Medicine

## 2017-08-16 NOTE — Telephone Encounter (Signed)
MEDICATION: Tramedol  PH[facility]ACY:  [id number]  IS THIS A 90 DAY SUPPLY : no  IS PATIENT OUT OF MEDICATION: yes  IF NOT; HOW MUCH IS LEFT: enough until Friday 09/28  LAST APPOINTMENT DATE: 07/10  NEXT APPOINTMENT DATE:@10 /10/2017  OTHER COMMENTS:    **Let patient know to contact pharmacy at the end of the day to make sure medication is ready. **  ** Please notify patient to allow 48-72 hours to process**  **Encourage patient to contact the pharmacy for refills or they can request refills through [facility]**

## 2017-08-16 NOTE — Telephone Encounter (Signed)
Last refill 06/01/2017 #120 with 3 refills.

## 2017-08-18 ENCOUNTER — Other Ambulatory Visit: Payer: Self-pay | Admitting: Sports Medicine

## 2017-09-03 ENCOUNTER — Ambulatory Visit: Payer: BLUE CROSS/BLUE SHIELD | Admitting: Sports Medicine

## 2017-09-13 ENCOUNTER — Telehealth: Payer: Self-pay | Admitting: Sports Medicine

## 2017-09-13 ENCOUNTER — Other Ambulatory Visit: Payer: Self-pay | Admitting: Sports Medicine
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
## 2017-09-14 ENCOUNTER — Other Ambulatory Visit: Payer: Self-pay

## 2017-09-14 MED ORDER — TRAMADOL HCL 50 MG PO TABS: 50.0000 mg | ORAL_TABLET | Freq: Four times a day (QID) | ORAL | 0 refills | Status: DC | PRN

## 2017-09-14 NOTE — Telephone Encounter (Signed)
Rx has been refilled, LM on Rx line at pharmacy.

## 2017-09-14 NOTE — Telephone Encounter (Signed)
Called pt and advised that rx has been refilled.  

## 2017-09-28 ENCOUNTER — Ambulatory Visit: Payer: BLUE CROSS/BLUE SHIELD | Admitting: Sports Medicine

## 2017-09-30 ENCOUNTER — Telehealth: Payer: Self-pay | Admitting: Sports Medicine

## 2017-09-30 NOTE — Telephone Encounter (Signed)
Patient wants to know if she can get refills w/o appt.  Her appt w/ Berline ChoughRigby was cancelled on the 13th due to his being out of the office, and she wants to know if her Karma Ganjaramedol can be refilled?  She's okay for now but she won't be able to come in for an appt unless it's late evening times.   Please advise,  Ty,  -LL  DISREGARD-patient scheduled for 11/21 at 3pm

## 2017-10-05 ENCOUNTER — Ambulatory Visit: Payer: BLUE CROSS/BLUE SHIELD | Admitting: Sports Medicine

## 2017-10-11 ENCOUNTER — Other Ambulatory Visit: Payer: Self-pay | Admitting: Sports Medicine

## 2017-10-11 NOTE — Telephone Encounter (Signed)
Spoke with patient and advised that Dr. Berline Choughigby would like to wait for future refills until her OV 10/13/17. Pt verbalized understanding and said that she has enough medication to last through tomorrow.

## 2017-10-11 NOTE — Telephone Encounter (Signed)
Called and left VM for pt to call the office.  

## 2017-10-11 NOTE — Telephone Encounter (Signed)
MEDICATION: tramedol 50mg   PHARMACY:  [id number]  [address]  IS THIS A 90 DAY SUPPLY : no  IS PATIENT OUT OF MEDICATION: yes  IF NOT; HOW MUCH IS LEFT: 0  LAST APPOINTMENT DATE: @10 /22/2018  NEXT APPOINTMENT DATE:@11 /21/2018  OTHER COMMENTS: Patient would like a call to update her on whether it's going to be processed.   **Let patient know to contact pharmacy at the end of the day to make sure medication is ready. **  ** Please notify patient to allow 48-72 hours to process**  **Encourage patient to contact the pharmacy for refills or they can [facility]**

## 2017-10-13 ENCOUNTER — Encounter: Payer: Self-pay | Admitting: Sports Medicine

## 2017-10-13 ENCOUNTER — Ambulatory Visit: Payer: BLUE CROSS/BLUE SHIELD | Admitting: Sports Medicine

## 2017-10-13 VITALS — BP 124/78 | HR 100 | Ht 65.0 in | Wt 257.6 lb

## 2017-10-13 DIAGNOSIS — G894 Chronic pain syndrome: Secondary | ICD-10-CM

## 2017-10-13 DIAGNOSIS — N319 Neuromuscular dysfunction of bladder, unspecified: Secondary | ICD-10-CM

## 2017-10-13 DIAGNOSIS — R339 Retention of urine, unspecified: Secondary | ICD-10-CM | POA: Diagnosis not present

## 2017-10-13 DIAGNOSIS — Z5181 Encounter for therapeutic drug level monitoring: Secondary | ICD-10-CM

## 2017-10-13 DIAGNOSIS — M722 Plantar fascial fibromatosis: Secondary | ICD-10-CM | POA: Diagnosis not present

## 2017-10-13 MED ORDER — TRAMADOL HCL 50 MG PO TABS: 50.0000 mg | ORAL_TABLET | Freq: Four times a day (QID) | ORAL | 2 refills | Status: DC | PRN

## 2017-10-13 MED ORDER — GABAPENTIN 400 MG PO CAPS: ORAL_CAPSULE | ORAL | 1 refills | Status: DC

## 2017-10-13 NOTE — Progress Notes (Signed)
Mary Mccullough D. Delorise Shinerigby, DO  [facility] [phone]  Mary Mccullough MRN [id number]  Date of birth: 09/28/1986                                           SUBJECTIVE:   Chief Complaint  Patient presents with  . Follow-up    complex regional pain syndrome, LLE   Mary BatmanMegan R Dewaine Mccullough is here for Follow-Up evaluation.   Her left lower leg symptoms initially: Began in 2016 and insidiously began.   Described as moderate to severe burning, swelling pain that occasionally radiates up her leg. Were worsened with activity:     She has been using chronic pain medicines, using compression heel cups intermittently. Currently her symptoms show no change  Chronic pain inventory Severity in Past 24o  BEST = 2/10 WORST = 7/10  Limitations in Daily Function  Using Likert scale (0 = Does not interfere; 10 - completely interferes) describe how - during the last 24 hours - pain has interfered with pt's:  General Activity 6/10 Mood 4/10 Ability to work (in or out of home) 1/10 Interactions with other people 1/10 Sleep 8/10 Enjoyment of Life 2/10  Adverse Effects of Meds NO SIDE EFFECTS unless XX below  Nausea   Vomiting  Confusion  Sleepiness  Fatigue  Constipation  OTHER    ROS   Yes    No    []       [x]   Night time disturbances   []       [x]   Fevers, chills, or night sweats   []       [x]   Unexplained weight loss   []       [x]   History of cancer   []       [x]   Changes in bowel or bladder habits   []       [x]   Recent unreported falls     HISTORY & PERTINENT PRIOR DATA:  Prior History reviewed and updated per electronic medical record. Significant history, findings, studies and interim changes include: Problem  Chronic Pain Syndrome   She has been intolerant to Cymbalta and Lyrica. Gabapentin has been less effective.   Plantar Fasciitis   No additional findings. No results for input(s): HGBA1C, LABURIC, CREATINE in the  last 8760 hours.  OBJECTIVE:  VS:  HT:5\' 5"  (165.1 cm)   WT:257 lb 9.6 oz (116.8 kg)  BMI:42.87    BP:124/78  HR:100bpm  TEMP: ( )  RESP:97 %  PHYSICAL EXAM: Adult Mccullough.  No acute distress.  Alert and appropriate. Left leg is overall improved from a swelling standpoint in the past.  She has focal tenderness still over the plantar fascia but this is mild.  DP PT pulses are 2+/4.  No significant dystrophic changes.  ASSESSMENT & PLAN:     ICD-10-CM   1. Plantar fasciitis M72.2   2. Chronic pain syndrome G89.4 traMADol (ULTRAM) 50 MG tablet    gabapentin (NEURONTIN) 400 MG capsule  3. Encounter for therapeutic drug monitoring Z51.81 traMADol (ULTRAM) 50 MG tablet    gabapentin (NEURONTIN) 400 MG capsule  4. Urinary retention with incomplete bladder emptying R33.9   5. Neurogenic dysfunction of the urinary bladder N31.9     Chronic pain syndrome She has been able to titrate down on medicines.  We will have her  continue to make these adjustments and discontinue doxepin at this time.  Refill for tramadol sent in.  4873-month follow-up for refills.  No refills outside of office visits and chronic pain contract signed today.  Plantar fasciitis Chronic findings of plantar fasciitis previously noted. Continue therapeutic exercises arch support and compression.  Follow-up: Return in about 3 months (around 01/13/2018).       Gaspar BiddingMichael Ulus Hazen, DO    [facility]

## 2017-10-13 NOTE — Assessment & Plan Note (Signed)
She has been able to titrate down on medicines.  We will have her continue to make these adjustments and discontinue doxepin at this time.  Refill for tramadol sent in.  5165-month follow-up for refills.  No refills outside of office visits and chronic pain contract signed today.

## 2017-10-13 NOTE — Assessment & Plan Note (Signed)
Chronic findings of plantar fasciitis previously noted. Continue therapeutic exercises arch support and compression.

## 2017-10-13 NOTE — Patient Instructions (Addendum)
Look into getting over-the-counter plantar fascia Dr. Margart SicklesScholl's insoles.

## 2017-10-18 NOTE — Progress Notes (Deleted)
Opened in error

## 2017-12-03 ENCOUNTER — Other Ambulatory Visit: Payer: Self-pay | Admitting: Sports Medicine

## 2017-12-03 DIAGNOSIS — Z5181 Encounter for therapeutic drug level monitoring: Secondary | ICD-10-CM

## 2017-12-03 DIAGNOSIS — G894 Chronic pain syndrome: Secondary | ICD-10-CM

## 2018-01-03 ENCOUNTER — Telehealth: Payer: Self-pay | Admitting: Sports Medicine

## 2018-01-03 ENCOUNTER — Other Ambulatory Visit: Payer: Self-pay | Admitting: Sports Medicine

## 2018-01-03 DIAGNOSIS — G894 Chronic pain syndrome: Secondary | ICD-10-CM

## 2018-01-03 DIAGNOSIS — Z5181 Encounter for therapeutic drug level monitoring: Secondary | ICD-10-CM

## 2018-01-03 NOTE — Telephone Encounter (Signed)
Per note from last OV and contract that pt signed, she needs to come into the office for f/u in order to get Tramadol refills.  Called pt and LM explaining that she needs to follow-up w/ Dr. Berline Choughigby in the office in order to get a refill on her Tramadol.

## 2018-01-03 NOTE — Telephone Encounter (Signed)
Rx refill request for provider review:  Tramadol 50 mg  LOV: 10/13/17 Last fill: 10/13/17  Pharmacy:verified

## 2018-01-03 NOTE — Telephone Encounter (Signed)
Please advise 

## 2018-01-03 NOTE — Telephone Encounter (Signed)
Copied from CRM [id number]. Topic: Quick Communication - Rx Refill/Question >> Jan 03, 2018  9:00 AM Cipriano BunkerLambe, Annette S wrote: Medication: traMADol (ULTRAM) 50 MG tablet   Has the patient contacted their pharmacy? Yes.      (Agent: If no, request that the patient contact the pharmacy for the refill.)   Preferred Pharmacy (with phone number or street name): [phone] [place] - [id number] [facility] [address] [place] [id number] Phone: [phone] Fax: [phone]  Agent: Please be advised that RX refills may take up to 3 business days. We ask that you follow-up with your pharmacy.

## 2018-01-10 ENCOUNTER — Encounter: Payer: Self-pay | Admitting: Sports Medicine

## 2018-01-10 ENCOUNTER — Ambulatory Visit: Payer: BLUE CROSS/BLUE SHIELD | Admitting: Sports Medicine

## 2018-01-10 VITALS — BP 120/92 | HR 104 | Ht 65.0 in | Wt 258.2 lb

## 2018-01-10 DIAGNOSIS — Z5181 Encounter for therapeutic drug level monitoring: Secondary | ICD-10-CM | POA: Diagnosis not present

## 2018-01-10 DIAGNOSIS — R339 Retention of urine, unspecified: Secondary | ICD-10-CM | POA: Diagnosis not present

## 2018-01-10 DIAGNOSIS — G894 Chronic pain syndrome: Secondary | ICD-10-CM | POA: Diagnosis not present

## 2018-01-10 DIAGNOSIS — M722 Plantar fascial fibromatosis: Secondary | ICD-10-CM

## 2018-01-10 MED ORDER — TRAMADOL HCL 50 MG PO TABS: 50.0000 mg | ORAL_TABLET | Freq: Four times a day (QID) | ORAL | 2 refills | Status: DC | PRN

## 2018-01-10 NOTE — Assessment & Plan Note (Signed)
Chronic thickening on prior MSK ultrasound Continue with Alfredson stretches Plantar fascia arch support and consider custom cushioned insoles she has not been interested in the past and this. Continue with equinus stretching.

## 2018-01-10 NOTE — Patient Instructions (Addendum)
Look into having your insurance company cover a set of custom orthotics.  The code is L3030 and there are 2 units.  You can call them  and ask if this is covered.  I am happy to do these for you at any time, you just need to let our front office schedulers know you would like an "orthotic appointment."   Also look into CEP plantar fascia sleeve.

## 2018-01-10 NOTE — Progress Notes (Addendum)
Mary Mccullough. [name] at [facility] [phone]  Mary Mccullough - 32 y.o. female MRN [id number]  Date of birth: 1985/11/26  Visit Date: 01/10/2018  PCP: [name], FNP   Referred by: [name], FNP   Scribe for today's visit: Stevenson Clinch, CMA     SUBJECTIVE:  Mary Mccullough is here for Follow-up (chronic pain syndrome)  Compared to the last office visit, her previously described symptoms show no change, sx very depending on what she does. Pain is worse when she works. Pain is also worse when its cold and rainy. She has been having trouble with swelling in her feet and when the swelling is present it is difficult to wear shoes.  Current symptoms are very in severity & are radiating to LT leg and LT hip.  She has been taking Tramadol with some relief. When she has a good day she doesn't have to take the 4th Tramadol. She does use compression, heel cups, and arch support while she works and she hasn't noticed much of a difference with her pain. She is also taking Gabapentin 400 mg 1 qAM and 2 qPM.     ROS Reports night time disturbances, worse when feet/legs are swollen. Denies fevers, chills, or night sweats. Denies unexplained weight loss. Denies personal history of cancer. Denies changes in bowel or bladder habits. Denies recent unreported falls. Denies new or worsening dyspnea or wheezing. Denies headaches or dizziness.  Reports numbness, tingling or weakness  In the extremities.  Denies dizziness or presyncopal episodes Reports lower extremity edema     HISTORY & PERTINENT PRIOR DATA:  Prior History reviewed and updated per electronic medical record.  Significant history, findings, studies and interim changes include:  reports that  has never smoked. she has never used smokeless tobacco. No results for input(s): HGBA1C, LABURIC, CREATINE in the last 8760 hours. No specialty comments available. Problem  Plantar  Fasciitis    OBJECTIVE:  VS:  HT:5\' 5"  (165.1 cm)   WT:258 lb 3.2 oz (117.1 kg)  BMI:42.97    BP:(!) 120/92  HR:(!) 104bpm  TEMP: ( )  RESP:99 %   PHYSICAL EXAM: Constitutional: WDWN, Non-toxic appearing. Psychiatric: Alert & appropriately interactive.  Not depressed or anxious appearing. Respiratory: No increased work of breathing.  Trachea Midline Eyes: Pupils are equal.  EOM intact without nystagmus.  No scleral icterus  NEUROVASCULAR exam: No clubbing or cyanosis appreciated No significant venous stasis changes Capillary Refill: normal, less than 2 seconds   Left foot is overall well aligned.  She has moderate TTP over the origin of the plantar fascia.  She has an equinus contracture to 85.  No significant swelling within the lower extremities.  She reports she has not been working this is why.   ASSESSMENT & PLAN:   1. Plantar fasciitis   2. Chronic pain syndrome   3. Encounter for therapeutic drug monitoring   4. Urinary retention with incomplete bladder emptying    PLAN:>50% of this 25 minute visit spent in direct patient counseling and/or coordination of care.  Discussion was focused on education regarding the in discussing the pathoetiology and anticipated clinical course of the above condition.  Ultimately she has chronic plantar fascia associated pain and leg pain that is been recalcitrant to multiple treatments.  Tramadol has been effective and this was refilled today.  Database was searched and one in consistency with cough syrup being filled however no additional refills.  Discussed this with her and discussed that if any type of controlled substances prescribed alerting us to this is mandatory.  Discussed using a compression sleeve for her heel pain and recommend a CEP compression sleeve.  Plantar fasciitis Chronic thickening on prior MSK ultrasound Continue with Alfredson stretches Plantar fascia arch support and consider custom cushioned insoles she has not  been interested in the past and this. Continue with equinus stretching.   ++++++++++++++++++++++++++++++++++++++++++++ Orders & Meds: No orders of the defined types were placed in this encounter.   Meds ordered this encounter  Medications  . traMADol (ULTRAM) 50 MG tablet    Sig: Take 1 tablet (50 mg total) by mouth every 6 (six) hours as needed.    Dispense:  120 tablet    Refill:  2    ++++++++++++++++++++++++++++++++++++++++++++ Follow-up: Return in about 10 weeks (around 03/21/2018).   Pertinent documentation may be included in additional procedure notes, imaging studies, problem based documentation and patient instructions. Please see these sections of the encounter for additional information regarding this visit. CMA/ATC served as Neurosurgeonscribe during this visit. History, Physical, and Plan performed by medical provider. Documentation and orders reviewed and attested to.      Andrena MewsMichael D Rigby, DO    [facility] Physician

## 2018-01-12 ENCOUNTER — Ambulatory Visit: Payer: BLUE CROSS/BLUE SHIELD | Admitting: Sports Medicine

## 2018-03-21 ENCOUNTER — Ambulatory Visit: Payer: BLUE CROSS/BLUE SHIELD | Admitting: Sports Medicine

## 2018-03-21 ENCOUNTER — Encounter: Payer: Self-pay | Admitting: Sports Medicine

## 2018-03-21 DIAGNOSIS — Z5181 Encounter for therapeutic drug level monitoring: Secondary | ICD-10-CM | POA: Diagnosis not present

## 2018-03-21 DIAGNOSIS — G894 Chronic pain syndrome: Secondary | ICD-10-CM

## 2018-03-21 NOTE — Progress Notes (Signed)
Mary Mccullough. Mary Mccullough Sports Medicine [facility] at [facility] [phone]  Mary STENSETH - 32 y.o. female MRN [id number]  Date of birth: Aug 10, 1986  Visit Date: 03/21/2018  PCP: [name], FNP   Referred by: [name], FNP  Scribe for today's visit: Stevenson Clinch, CMA     SUBJECTIVE:  Mary Mccullough is here for Follow-up (chronic pain syndrome)  01/10/2018: Compared to the last office visit, her previously described symptoms show no change, sx very depending on what she does. Pain is worse when she works. Pain is also worse when its cold and rainy. She has been having trouble with swelling in her feet and when the swelling is present it is difficult to wear shoes.  Current symptoms are very in severity & are radiating to LT leg and LT hip.  She has been taking Tramadol with some relief. When she has a good day she doesn't have to take the 4th Tramadol. She does use compression, heel cups, and arch support while she works and she hasn't noticed much of a difference with her pain. She is also taking Gabapentin 400 mg 1 qAM and 2 qPM.   03/21/2018: Compared to the last office visit, her previously described symptoms show no change Current symptoms very in severity depending on the day & are radiating to L leg and L hip.  She has noticed increased swelling d/t heat. She has also noticed pretty intense leg cramps in the L leg (from hip to foot) x 2 weeks. She has been using frozen water bottle and gets some relief from this. She was trying to wear a compression sleeve but hasn't been using it recently d/t swelling (feels too tight). She checked with her insurance regarding orthotics but unfortunately she was advised that they aren't covered. She has been taking tramadol and Gabapentin, tolerating well, no side effects.   ROS Reports night time disturbances. Denies fevers, chills, or night sweats. Denies unexplained weight loss. Denies personal history of  cancer. Denies changes in bowel or bladder habits. Denies recent unreported falls. Denies new or worsening dyspnea or wheezing. Denies headaches or dizziness.  Reports numbness, tingling or weakness  In the extremities.  Denies dizziness or presyncopal episodes Reports lower extremity edema    HISTORY & PERTINENT PRIOR DATA:  Prior History reviewed and updated per electronic medical record.  Significant/pertinent history, findings, studies include:  reports that she has never smoked. She has never used smokeless tobacco. No results for input(s): HGBA1C, LABURIC, CREATINE in the last 8760 hours. No specialty comments available. No problems updated.  OBJECTIVE:  VS:  HT:5\' 5"  (165.1 cm)   WT:264 lb 6.4 oz (119.9 kg)  BMI:44    BP:110/82  HR:(Abnormal) 104bpm  TEMP: ( )  RESP:99 %   PHYSICAL EXAM: Constitutional: WDWN, Non-toxic appearing. Psychiatric: Alert & appropriately interactive.  Not depressed or anxious appearing. Respiratory: No increased work of breathing.  Trachea Midline Eyes: Pupils are equal.  EOM intact without nystagmus.  No scleral icterus  Vascular Exam: warm to touch no edema  lower extremity neuro exam: unremarkable  MSK Exam: She walks with a slightly antalgic gait.  Easy heel toe ambulation.  She does have focal pain with palpation of the foot.   ASSESSMENT & PLAN:   1. Chronic pain syndrome   2. Encounter for therapeutic drug monitoring     PLAN: Refill of chronic stable medications.  Symptoms are minimal.  Will defer drug testing  given tramadol.  Will consider UDS at follow-up but okay to defer at this time.  Follow-up: Return in about 3 months (around 06/20/2018).      Please see additional documentation for Objective, Assessment and Plan sections. Pertinent additional documentation may be included in corresponding procedure notes, imaging studies, problem based documentation and patient instructions. Please see these sections of the  encounter for additional information regarding this visit.  CMA/ATC served as Neurosurgeon during this visit. History, Physical, and Plan performed by medical provider. Documentation and orders reviewed and attested to.      [name], DO    Mary Mccullough

## 2018-03-22 ENCOUNTER — Telehealth: Payer: Self-pay | Admitting: Sports Medicine

## 2018-03-22 NOTE — Telephone Encounter (Signed)
Copied from CRM [id number]. Topic: Quick Communication - See Telephone Encounter >> Mar 22, 2018  4:39 PM Terisa Starr wrote: CRM for notification. See Telephone encounter for: 03/22/18.   Patient said that she saw Dr Berline Chough yesterday and he was going to send over her scripts. She doesn't need the medicines yet but wanted to make sure he did not forget  [id number] - [place], [place] - [address] AT [facility] [address]  She said that she saw her primary doctor and she gave her new medicines. She gave him Doxycycline & Phenergan.

## 2018-03-22 NOTE — Telephone Encounter (Signed)
See note

## 2018-03-23 MED ORDER — TRAMADOL HCL 50 MG PO TABS: 50.0000 mg | ORAL_TABLET | Freq: Four times a day (QID) | ORAL | 2 refills | Status: DC | PRN

## 2018-03-23 NOTE — Telephone Encounter (Signed)
I will contact pt after rx has been refilled.

## 2018-03-25 NOTE — Telephone Encounter (Signed)
Pt contacted and informed that her Tramadol rx was placed on 03/23/18 and should be ready at her pharmacy

## 2018-03-25 NOTE — Telephone Encounter (Signed)
I believe all Rx have been sent in. Please advise if not

## 2018-04-01 ENCOUNTER — Encounter: Payer: Self-pay | Admitting: Sports Medicine

## 2018-04-08 ENCOUNTER — Telehealth: Payer: Self-pay | Admitting: Sports Medicine

## 2018-04-08 NOTE — Telephone Encounter (Signed)
See note

## 2018-04-08 NOTE — Telephone Encounter (Signed)
Copied from CRM [id number]. Topic: Quick Communication - See Telephone Encounter >> Apr 08, 2018  5:03 PM Rudi Coco, [place] wrote: CRM for notification. See Telephone encounter for: 04/08/18.  Pt. Calling to let Dr. Berline Chough know that she is on antibiotics and cough syrup for possible strep

## 2018-04-11 NOTE — Telephone Encounter (Signed)
Called pt and left non-discript message advising that her call was received and message was forwarded on to Dr. Berline Chough. No further action needed at this time.

## 2018-06-08 ENCOUNTER — Telehealth: Payer: Self-pay | Admitting: General Practice

## 2018-06-08 NOTE — Telephone Encounter (Signed)
Acknowledged. That is acceptable. Controlled substance contract will need to pull database at f/u

## 2018-06-08 NOTE — Telephone Encounter (Signed)
FYI

## 2018-06-08 NOTE — Telephone Encounter (Signed)
Copied from CRM [id number]. Topic: Quick Communication - See Telephone Encounter >> Jun 08, 2018  3:58 PM Tamela OddiMartin, [place], [place] wrote: CRM for notification. See Telephone encounter for: 06/08/18. Patient called and states she wanted to make Dr. Berline Choughigby aware that she was prescribed Zofran for a GI bug that she has. Thank you

## 2018-06-10 ENCOUNTER — Other Ambulatory Visit: Payer: Self-pay

## 2018-06-10 ENCOUNTER — Other Ambulatory Visit: Payer: Self-pay | Admitting: Sports Medicine

## 2018-06-10 DIAGNOSIS — Z5181 Encounter for therapeutic drug level monitoring: Secondary | ICD-10-CM

## 2018-06-10 DIAGNOSIS — G894 Chronic pain syndrome: Secondary | ICD-10-CM

## 2018-06-10 MED ORDER — GABAPENTIN 400 MG PO CAPS: ORAL_CAPSULE | ORAL | 0 refills | Status: DC

## 2018-06-10 NOTE — Telephone Encounter (Signed)
Refill of Neurontin  LRF 12/03/17  #270  1 refill   Refill of Tramadol  LRF 03/23/18  #120  2 refills   LOV 03/21/18 Dr. Berline Choughigby  Pt has appt 06/15/18   [id number] [place] - [address] [id number] Phone: [phone] Fax: [phone]

## 2018-06-10 NOTE — Telephone Encounter (Signed)
Copied from CRM [id number]. Topic: Quick Communication - Rx Refill/Question >> Jun 10, 2018  9:25 AM Raquel SarnaHayes, Teresa G wrote: gabapentin (NEURONTIN) 400 MG capsule  traMADol (ULTRAM) 50 MG tablet  Pt needing refills   [phone] [place] - [place] AT [facility] [address] [place] [id number] Phone: [phone] Fax: [phone] Not a 24 hour pharmacy; exact hours not known

## 2018-06-15 ENCOUNTER — Ambulatory Visit: Payer: BLUE CROSS/BLUE SHIELD | Admitting: Sports Medicine

## 2018-06-15 ENCOUNTER — Encounter: Payer: Self-pay | Admitting: Sports Medicine

## 2018-06-15 VITALS — BP 116/80 | HR 120 | Ht 65.0 in | Wt 259.2 lb

## 2018-06-15 DIAGNOSIS — N319 Neuromuscular dysfunction of bladder, unspecified: Secondary | ICD-10-CM | POA: Diagnosis not present

## 2018-06-15 DIAGNOSIS — G894 Chronic pain syndrome: Secondary | ICD-10-CM

## 2018-06-15 DIAGNOSIS — Z5181 Encounter for therapeutic drug level monitoring: Secondary | ICD-10-CM

## 2018-06-15 DIAGNOSIS — M24573 Contracture, unspecified ankle: Secondary | ICD-10-CM

## 2018-06-15 DIAGNOSIS — M722 Plantar fascial fibromatosis: Secondary | ICD-10-CM

## 2018-06-15 MED ORDER — TRAMADOL HCL 50 MG PO TABS: 50.0000 mg | ORAL_TABLET | Freq: Four times a day (QID) | ORAL | 2 refills | Status: DC | PRN

## 2018-06-15 NOTE — Progress Notes (Signed)
Veverly FellsMichael D. Delorise Shinerigby, DO  [facility] [place] [phone]  Mary MayoMegan R Mccullough - 32 y.o. female MRN [id number]  Date of birth: 03/02/1986  Visit Date: 06/15/2018  PCP: Delorse LekBlair, Diane W, FNP   Referred by: Delorse LekBlair, Diane W, FNP  Scribe(s) for today's visit: Christoper FabianMolly Weber, LAT, ATC  SUBJECTIVE:  Mary MayoMegan R Mccullough is here for Follow-up (chronic pain syndrome) .    01/10/2018: Compared to the last office visit, her previously described symptoms show no change, sx very depending on what she does. Pain is worse when she works. Pain is also worse when its cold and rainy. She has been having trouble with swelling in her feet and when the swelling is present it is difficult to wear shoes.  Current symptoms are very in severity & are radiating to LT leg and LT hip.  She has been taking Tramadol with some relief. When she has a good day she doesn't have to take the 4th Tramadol. She does use compression, heel cups, and arch support while she works and she hasn't noticed much of a difference with her pain. She is also taking Gabapentin 400 mg 1 qAM and 2 qPM.   03/21/2018: Compared to the last office visit, her previously described symptoms show no change Current symptoms very in severity depending on the day & are radiating to L leg and L hip.  She has noticed increased swelling d/t heat. She has also noticed pretty intense leg cramps in the L leg (from hip to foot) x 2 weeks. She has been using frozen water bottle and gets some relief from this. She was trying to wear a compression sleeve but hasn't been using it recently d/t swelling (feels too tight). She checked with her insurance regarding orthotics but unfortunately she was advised that they aren't covered. She has been taking tramadol and Gabapentin, tolerating well, no side effects.   06/15/2018: Compared to the last office visit on 03/21/18, her previously described symptoms show no change.  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  She notes that she feels more swelling in her L leg due to climbing stairs.  She notes that summer has been better for her overall due to being able to use the pool which helps w/ both the pain and swelling. Current symptoms are mild currently due to not working today & are radiating to L hip and leg. She has been taking Tramadol and Gabapentin.  She uses a frozen water bottle for leg cramps.   REVIEW OF SYSTEMS: Reports night time disturbances.  Sometimes. Denies fevers, chills, or night sweats. Denies unexplained weight loss. Denies personal history of cancer. Denies changes in bowel or bladder habits. Denies recent unreported falls. Denies new or worsening dyspnea or wheezing. Denies headaches or dizziness.  Reports numbness, tingling or weakness  In the extremities - in the L LE Denies dizziness or presyncopal episodes Reports lower extremity edema    HISTORY & PERTINENT PRIOR DATA:  Prior History reviewed and updated per electronic medical record.  Significant/pertinent history, findings, studies include:  reports that she has never smoked. She has never used smokeless tobacco. No results for input(s): HGBA1C, LABURIC, CREATINE in the last 8760 hours. No specialty comments available. Problem  Equinus Contracture of Ankle    OBJECTIVE:  VS:  HT:5\' 5"  (165.1 cm)   WT:259 lb  3.2 oz (117.6 kg)  BMI:43.13    BP:116/80  HR:(Abnormal) 120bpm  TEMP: ( )  RESP:98 %   PHYSICAL EXAM: Constitutional: WDWN, Non-toxic appearing. Psychiatric: Alert & appropriately interactive.  Not depressed or anxious appearing. Respiratory: No increased work of breathing.  Trachea Midline Eyes: Pupils are equal.  EOM intact without nystagmus.  No scleral icterus  Vascular Exam: warm to touch no edema  lower extremity neuro exam: unremarkable normal strength normal sensation  MSK  Exam: Left lower extremity is overall well aligned she has pain with palpation of the origin of the plantar fascia.  Ankle dorsiflexion to 80 degrees with knee straight and 120 degrees with the knee bent.  She has good plantar flexion dorsiflexion strength.  Negative straight leg raise.  No significant dystrophic change and PT pulses are palpable.   ASSESSMENT & PLAN:   1. Plantar fasciitis   2. Chronic pain syndrome   3. 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 Equinus contracture of ankle     PLAN: Overall she has chronic myofascial pain that has responded well to tramadol and gabapentin.  She does get intermittent swelling and pitting edema is likely related to venous insufficiency in the setting of an equinus contracture and chronic plantar fasciitis.  She is not a candidate for an MRI due to bladder stimulator and has done well with her current regimen.  We will follow-up in 3 months for medication refill and can consider UDS at that time.  Follow-up: Return in about 3 months (around 09/15/2018).      Please see additional documentation for Objective, Assessment and Plan sections. Pertinent additional documentation may be included in corresponding procedure notes, imaging studies, problem based documentation and patient instructions. Please see these sections of the encounter for additional information regarding this visit.  CMA/ATC served as Neurosurgeon during this visit. History, Physical, and Plan performed by medical provider. Documentation and orders reviewed and attested to.      [name], DO    [facility]

## 2018-06-27 ENCOUNTER — Ambulatory Visit: Payer: BLUE CROSS/BLUE SHIELD | Admitting: Sports Medicine

## 2018-07-12 ENCOUNTER — Other Ambulatory Visit: Payer: Self-pay | Admitting: Sports Medicine

## 2018-07-12 DIAGNOSIS — Z5181 Encounter for therapeutic drug level monitoring: Secondary | ICD-10-CM

## 2018-07-12 DIAGNOSIS — G894 Chronic pain syndrome: Secondary | ICD-10-CM

## 2018-09-02 ENCOUNTER — Telehealth: Payer: Self-pay | Admitting: Radiology

## 2018-09-02 DIAGNOSIS — G894 Chronic pain syndrome: Secondary | ICD-10-CM

## 2018-09-02 DIAGNOSIS — Z5181 Encounter for therapeutic drug level monitoring: Secondary | ICD-10-CM

## 2018-09-02 NOTE — Telephone Encounter (Signed)
Needs appointment

## 2018-09-02 NOTE — Telephone Encounter (Signed)
Copied from CRM [id number]. Topic: General - Other >> Sep 02, 2018 11:30 AM Percival Spanish wrote:  Pt said her appt 09/15/18 and said she will be out of her medication  and is asking if some pills can be called in until her visit  traMADol (ULTRAM) 50 MG tablet

## 2018-09-02 NOTE — Addendum Note (Signed)
Addended by: Dierdre Searles on: 09/02/2018 02:41 PM   Modules accepted: Orders

## 2018-09-02 NOTE — Telephone Encounter (Signed)
Forwarding to Dr. Rigby.  

## 2018-09-04 ENCOUNTER — Other Ambulatory Visit: Payer: Self-pay | Admitting: Sports Medicine

## 2018-09-04 DIAGNOSIS — G894 Chronic pain syndrome: Secondary | ICD-10-CM

## 2018-09-04 DIAGNOSIS — Z5181 Encounter for therapeutic drug level monitoring: Secondary | ICD-10-CM

## 2018-09-05 NOTE — Telephone Encounter (Signed)
Called pt and advised. She will check with her job to see if she can get time off to move her appointment up.

## 2018-09-05 NOTE — Addendum Note (Signed)
Addended by: Dierdre Searles on: 09/05/2018 08:43 AM   Modules accepted: Orders

## 2018-09-05 NOTE — Telephone Encounter (Signed)
Looks like pt was able to get her appointment moved up to 09/06/18. Called [phone] (on DPR) and left VM advised that Dr. Berline Chough is requesting that she bring all of her medication bottles along with any remaining pills to her appointment.

## 2018-09-06 ENCOUNTER — Ambulatory Visit: Payer: BLUE CROSS/BLUE SHIELD | Admitting: Sports Medicine

## 2018-09-12 ENCOUNTER — Ambulatory Visit: Payer: BLUE CROSS/BLUE SHIELD | Admitting: Sports Medicine

## 2018-09-12 ENCOUNTER — Encounter: Payer: Self-pay | Admitting: Sports Medicine

## 2018-09-12 VITALS — BP 112/78 | HR 111 | Ht 65.0 in | Wt 264.4 lb

## 2018-09-12 DIAGNOSIS — M722 Plantar fascial fibromatosis: Secondary | ICD-10-CM | POA: Diagnosis not present

## 2018-09-12 DIAGNOSIS — Z5181 Encounter for therapeutic drug level monitoring: Secondary | ICD-10-CM | POA: Diagnosis not present

## 2018-09-12 DIAGNOSIS — Z3A17 17 weeks gestation of pregnancy: Secondary | ICD-10-CM | POA: Insufficient documentation

## 2018-09-12 DIAGNOSIS — N319 Neuromuscular dysfunction of bladder, unspecified: Secondary | ICD-10-CM | POA: Diagnosis not present

## 2018-09-12 DIAGNOSIS — M24573 Contracture, unspecified ankle: Secondary | ICD-10-CM

## 2018-09-12 DIAGNOSIS — G894 Chronic pain syndrome: Secondary | ICD-10-CM | POA: Diagnosis not present

## 2018-09-12 MED ORDER — GABAPENTIN 400 MG PO CAPS: ORAL_CAPSULE | ORAL | 0 refills | Status: DC

## 2018-09-12 MED ORDER — TRAMADOL HCL 50 MG PO TABS: 50.0000 mg | ORAL_TABLET | Freq: Four times a day (QID) | ORAL | 2 refills | Status: DC | PRN

## 2018-09-12 NOTE — Progress Notes (Signed)
Mary Mccullough. [facility] [phone]  Mary Mccullough - 32 y.o. female MRN [id number]  Date of birth: 09-18-1986  Visit Date: 09/12/2018  PCP: [name], FNP   Referred by: [name], FNP  Scribe(s) for today's visit: Christoper Fabian, LAT, ATC  SUBJECTIVE:  Mary Mccullough is here for Follow-up (chronic pain) .    01/10/2018: Compared to the last office visit, her previously described symptoms show no change, sx very depending on what she does. Pain is worse when she works. Pain is also worse when its cold and rainy. She has been having trouble with swelling in her feet and when the swelling is present it is difficult to wear shoes.  Current symptoms are very in severity & are radiating to LT leg and LT hip.  She has been taking Tramadol with some relief. When she has a good day she doesn't have to take the 4th Tramadol. She does use compression, heel cups, and arch support while she works and she hasn't noticed much of a difference with her pain. She is also taking Gabapentin 400 mg 1 qAM and 2 qPM.   03/21/2018: Compared to the last office visit, her previously described symptoms show no change Current symptoms very in severity depending on the day & are radiating to L leg and L hip.  She has noticed increased swelling d/t heat. She has also noticed pretty intense leg cramps in the L leg (from hip to foot) x 2 weeks. She has been using frozen water bottle and gets some relief from this. She was trying to wear a compression sleeve but hasn't been using it recently d/t swelling (feels too tight). She checked with her insurance regarding orthotics but unfortunately she was advised that they aren't covered. She has been taking tramadol and Gabapentin, tolerating well, no side effects.   06/15/2018: Compared to the last office visit on 03/21/18, her previously described symptoms show no change.  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  She notes that she feels more swelling in her L leg due to climbing stairs.  She notes that summer has been better for her overall due to being able to use the pool which helps w/ both the pain and swelling. Current symptoms are mild currently due to not working today & are radiating to L hip and leg. She has been taking Tramadol and Gabapentin.  She uses a frozen water bottle for leg cramps.  09/12/2018: Compared to the last office visit on 06/15/18, her previously described symptoms show no change. Pt notes that she is pregnant and due in mid-March 2019 so is experiencing more swelling in her feet as a result.  OB wants her to talk to Dr. Berline Chough about decreasing the Gabapentin if possible to once at night as well as decreasing the mg dose but is ok w/ her staying on the Tramadol. Current symptoms are ranging in severity depending on how much she's on her feet & are radiating to L hip and leg.  She reports increased swelling in her L calf recently. She has been taking Tramadol and Gabapentin.  REVIEW OF SYSTEMS: Reports night time disturbances.  Sometimes. Denies fevers, chills, or night sweats. Denies unexplained weight loss. Denies personal history of cancer. Denies changes in bowel or bladder habits. Denies recent  unreported falls. Denies new or worsening dyspnea or wheezing. Denies headaches or dizziness.  Reports numbness, tingling or weakness  In the extremities - in the L LE Denies dizziness or presyncopal episodes Reports lower extremity edema    HISTORY:  Prior history reviewed and updated per electronic medical record.  Social History   Occupational History  . Not on file  Tobacco Use  . Smoking status: Never Smoker  . Smokeless tobacco: Never Used  Substance and Sexual Activity  . Alcohol use: Yes    Comment: very rarely  . Drug use: Not on file  . Sexual activity:  Not on file   Social History   Social History Narrative  . Not on file     DATA OBTAINED & REVIEWED:  No results for input(s): HGBA1C, LABURIC, CREATINE in the last 8760 hours. .   OBJECTIVE:  VS:  HT:5\' 5"  (165.1 cm)   WT:264 lb 6.4 oz (119.9 kg)  BMI:44    BP:112/78  HR:(!) 111bpm  TEMP: ( )  RESP:97 %   PHYSICAL EXAM: CONSTITUTIONAL: Well-developed, Well-nourished and In no acute distress PSYCHIATRIC: Alert & appropriately interactive. and Not depressed or anxious appearing. RESPIRATORY: No increased work of breathing and Trachea Midline EYES: Pupils are equal., EOM intact without nystagmus. and No scleral icterus.  VASCULAR EXAM: Warm and well perfused NEURO: unremarkable  MSK Exam: Left leg and back  Well aligned, no significant deformity. No overlying skin changes. Left lower extremity is overall well aligned she has pain with palpation of the origin of the plantar fascia.  Ankle dorsiflexion to 80 degrees with knee straight and 120 degrees with the knee bent.  She has good plantar flexion dorsiflexion strength.  Negative straight leg raise.  No significant dystrophic change and PT pulses are palpable.     ASSESSMENT   1. Plantar fasciitis   2. Chronic pain syndrome   3. 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 Equinus contracture of ankle   6. [redacted] weeks gestation of pregnancy     PLAN:  Pertinent additional documentation may be included in corresponding procedure notes, imaging studies, problem based documentation and patient instructions.  Procedures:  . None  Medications:  Tramadol (sent with 2 factor authentication) and gabapentin prescription sent in.  Pharmacy will be called to verify receipt  Discussion/Instructions: [redacted] weeks gestation of pregnancy Obviously complicating her chronic underlying pain syndrome.  Did discuss both tramadol and gabapentin category C medications and risk to the  fetus is not entirely known.  There is a slightly increased risk of withdrawal with concomitant opioid therapy and gabapentin this is typically not seen with tramadol.  Do not recommend any further titration of these medicines and would like to consider weaning back on all of medicine including gabapentin and tramadol.  Recommend that she does add Tylenol to the tramadol to see if she can have a synergistic effect.  Refills today for both gabapentin and tramadol based on appropriate filling patterns from the controlled substance database.  . Encouraged patient to work on cutting back on both tramadol and gabapentin.  Will defer to her OB regarding physical activity limitations. .   . Continue previously prescribed home exercise program.  . Discussed red flag symptoms that warrant earlier emergent evaluation and patient voices understanding. . Activity modifications and the importance of avoiding exacerbating activities (limiting pain to no more than a 4 / 10 during or following activity) recommended and discussed.  Follow-up:  . Return in about 3 months (around 12/13/2018).       CMA/ATC served as Neurosurgeon during this visit. History, Physical, and Plan performed by medical provider. Documentation and orders reviewed and attested to.      [name], DO    [facility]

## 2018-09-12 NOTE — Assessment & Plan Note (Signed)
Obviously complicating her chronic underlying pain syndrome.  Did discuss both tramadol and gabapentin category C medications and risk to the fetus is not entirely known.  There is a slightly increased risk of withdrawal with concomitant opioid therapy and gabapentin this is typically not seen with tramadol.  Do not recommend any further titration of these medicines and would like to consider weaning back on all of medicine including gabapentin and tramadol.  Recommend that she does add Tylenol to the tramadol to see if she can have a synergistic effect.  Refills today for both gabapentin and tramadol based on appropriate filling patterns from the controlled substance database.

## 2018-09-15 ENCOUNTER — Ambulatory Visit: Payer: BLUE CROSS/BLUE SHIELD | Admitting: Sports Medicine

## 2018-11-02 ENCOUNTER — Other Ambulatory Visit: Payer: Self-pay | Admitting: Sports Medicine

## 2018-11-02 DIAGNOSIS — G894 Chronic pain syndrome: Secondary | ICD-10-CM

## 2018-11-02 DIAGNOSIS — Z5181 Encounter for therapeutic drug level monitoring: Secondary | ICD-10-CM

## 2018-11-02 NOTE — Addendum Note (Signed)
Addended by: Dierdre SearlesOLEMAN, Angeligue Bowne S on: 11/02/2018 04:14 PM   Modules accepted: Orders

## 2018-11-06 MED ORDER — GABAPENTIN 400 MG PO CAPS: ORAL_CAPSULE | ORAL | 0 refills | Status: DC

## 2018-11-25 ENCOUNTER — Ambulatory Visit: Payer: BLUE CROSS/BLUE SHIELD | Admitting: Sports Medicine

## 2018-11-25 ENCOUNTER — Encounter: Payer: Self-pay | Admitting: Sports Medicine

## 2018-11-25 VITALS — BP 102/72 | HR 108 | Ht 65.0 in | Wt 269.0 lb

## 2018-11-25 DIAGNOSIS — Z3A30 30 weeks gestation of pregnancy: Secondary | ICD-10-CM

## 2018-11-25 DIAGNOSIS — M24573 Contracture, unspecified ankle: Secondary | ICD-10-CM

## 2018-11-25 DIAGNOSIS — G894 Chronic pain syndrome: Secondary | ICD-10-CM

## 2018-11-25 DIAGNOSIS — N319 Neuromuscular dysfunction of bladder, unspecified: Secondary | ICD-10-CM | POA: Diagnosis not present

## 2018-11-25 DIAGNOSIS — M722 Plantar fascial fibromatosis: Secondary | ICD-10-CM

## 2018-11-25 DIAGNOSIS — Z5181 Encounter for therapeutic drug level monitoring: Secondary | ICD-10-CM

## 2018-11-25 MED ORDER — GABAPENTIN 400 MG PO CAPS: ORAL_CAPSULE | ORAL | 0 refills | Status: DC

## 2018-11-25 MED ORDER — TRAMADOL HCL 50 MG PO TABS: 50.0000 mg | ORAL_TABLET | Freq: Four times a day (QID) | ORAL | 2 refills | Status: AC | PRN

## 2018-11-25 NOTE — Progress Notes (Signed)
Mary Mccullough. [facility] at [facility] [phone]  Mary Mccullough - 33 y.o. female MRN [id number]  Date of birth: 05-27-1986  Visit Date: 11/25/2018  PCP: [name], FNP   Referred by: [name], FNP   SUBJECTIVE:  Chief Complaint  Patient presents with  . Follow-up    B foot pain and L leg pain    HPI: Patient is here for follow-up of her chronic left leg and foot pain.  She is [redacted] weeks pregnant and has been having some complications with nausea vomiting is being followed by her OB GYN for this.  They did give her a short course of hydrocodone previously due to worsening abdominal pain.  She continues to take gabapentin and tramadol.  REVIEW OF SYSTEMS: Is having a difficult time sleeping at night due to pregnancy.  She continues to have left leg numbness tingling and weakness.  She has had lower extremity swelling that is significant at times but overall since being able to be off her feet more has improved.  HISTORY:  Prior history reviewed and updated per electronic medical record.  Social History   Occupational History  . Not on file  Tobacco Use  . Smoking status: Never Smoker  . Smokeless tobacco: Never Used  Substance and Sexual Activity  . Alcohol use: Yes    Comment: very rarely  . Drug use: Not on file  . Sexual activity: Not on file   Social History   Social History Narrative  . Not on file      DATA OBTAINED & REVIEWED:  No results for input(s): HGBA1C, LABURIC, CREATINE, CALCIUM, AST, ALT, TSH in the last 8760 hours.  Invalid input(s): MAGNESIUM, CK No problems updated. No specialty comments available.  OBJECTIVE:  VS:  HT:5\' 5"  (165.1 cm)   WT:269 lb (122 kg)  BMI:44.76    BP:102/72  HR:(!) 108bpm  TEMP: ( )  RESP:97 %   PHYSICAL EXAM: Adult obese female.  No acute distress.  Alert and appropriate.  She has a gravid abdomen.  Lower extremity swelling is improved compared to the  prior episodes.  She continues to have pain along her plantar aspect of her foot.  She walks with a normal gait however is wide-based.   ASSESSMENT  1. Plantar fasciitis   2. Chronic pain syndrome   3. 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 Equinus contracture of ankle   6. [redacted] weeks gestation of pregnancy     PLAN:  Pertinent additional documentation may be included in corresponding procedure notes, imaging studies, problem based documentation and patient instructions.  Procedures:  None  Medications:  Meds ordered this encounter  Medications  . gabapentin (NEURONTIN) 400 MG capsule    Sig: TAKE 1 CAPSULE BY MOUTH EVERY DAY AND 2 CAPSULES EVERY NIGHT AT BEDTIME    Dispense:  90 capsule    Refill:  0  . traMADol (ULTRAM) 50 MG tablet    Sig: Take 1 tablet (50 mg total) by mouth every 6 (six) hours as needed.    Dispense:  120 tablet    Refill:  2    Discussion/Instructions: No problem-specific Assessment & Plan notes found for this encounter.  Long discussion today regarding medication management options.  She was forthcoming about the hydrocodone and as not to mix the tramadol with this.  She will need pharmacologic therapy following delivery  and I will defer this to her OB gynecologist.  I am hesitant to have her continue with the gabapentin especially if on opioids given the increased risk of neonatal withdrawal syndrome and this was discussed with her in great detail today.  Encouraged her to try to avoid the use of opioids at all costs predelivery due to this increased risk.  She is aware of this and voices appropriate understanding.   Return in about 12 weeks (around 02/17/2019).          [name], DO    [facility] Physician

## 2018-11-30 ENCOUNTER — Other Ambulatory Visit: Payer: Self-pay | Admitting: Sports Medicine

## 2018-11-30 DIAGNOSIS — Z5181 Encounter for therapeutic drug level monitoring: Secondary | ICD-10-CM

## 2018-11-30 DIAGNOSIS — G894 Chronic pain syndrome: Secondary | ICD-10-CM

## 2018-12-03 ENCOUNTER — Encounter: Payer: Self-pay | Admitting: Sports Medicine

## 2018-12-08 ENCOUNTER — Telehealth: Payer: Self-pay | Admitting: Sports Medicine

## 2018-12-08 NOTE — Telephone Encounter (Signed)
See note

## 2018-12-08 NOTE — Telephone Encounter (Signed)
Copied from CRM [id number]. Topic: Quick Communication - See Telephone Encounter >> Dec 08, 2018  5:09 PM Jens Som A wrote: CRM for notification. See Telephone encounter for: 12/08/18.  Dr. Berline Chough wants the patient to call when she is on new prescriptions.  She is now takes narco 5mg  just at night time as needed.  She just wanted by to know. Patient is asking for  Dr. Janeece Riggers nurse to call her back [phone]

## 2018-12-09 ENCOUNTER — Other Ambulatory Visit: Payer: Self-pay

## 2018-12-09 NOTE — Telephone Encounter (Signed)
Med list updated. Called pt and left VM to call the office.

## 2018-12-13 ENCOUNTER — Ambulatory Visit: Payer: BLUE CROSS/BLUE SHIELD | Admitting: Sports Medicine

## 2019-01-05 ENCOUNTER — Other Ambulatory Visit: Payer: Self-pay | Admitting: Sports Medicine

## 2019-01-05 DIAGNOSIS — G894 Chronic pain syndrome: Secondary | ICD-10-CM

## 2019-01-05 DIAGNOSIS — Z5181 Encounter for therapeutic drug level monitoring: Secondary | ICD-10-CM

## 2019-02-02 ENCOUNTER — Telehealth: Payer: Self-pay

## 2019-02-02 NOTE — Telephone Encounter (Signed)
Copied from CRM [id number]. Topic: General - Other >> Feb 01, 2019  3:15 PM [place], [place] D wrote: Reason for CRM: Pt delivered her baby 1 1/2 months early and was put on some new medications due to this and has gained some fluid weight. She would like to speak with Gearldine Bienenstock about this. She was put on controlled substances and would like for Dr. Berline Chough to know.

## 2019-02-02 NOTE — Telephone Encounter (Signed)
Spoke with pt, she and the baby are now out of the hospital. She wanted to let Dr. Berline Chough know that she was in and out of the hospital with complications before and after delivery. She was put on meds for pain during this time. She was admitted to [facility]. She has a lot of weight gain, 57 lbs, at the end of pregnancy, edema in her legs and now anemia. She wanted to make Dr. Berline Chough aware of what is going on. She knows she may be due for follow-up with Dr. Berline Chough but wanted to make sure he was aware of health conditions and pain meds prescribed.   Forwarding to Dr. Berline Chough, recommended time frame for f/u?

## 2019-02-03 NOTE — Telephone Encounter (Signed)
Appropriate Rx for other provider to be providing.  I can plan to see her back in April.  She was previously provided a 90 day supply of Tramadol and with the additional Rx's and hospitalization she is not eligible for refill until at least then.  I would like to see if she can get her discharge summary from the hospitalization so we can have appropriate documentation around the use of the other controlled substances.

## 2019-02-09 NOTE — Telephone Encounter (Signed)
Called pt and left VM to have discharge summary faxed to [place].
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
## 2019-03-04 ENCOUNTER — Other Ambulatory Visit: Payer: Self-pay | Admitting: Sports Medicine

## 2019-03-04 DIAGNOSIS — Z5181 Encounter for therapeutic drug level monitoring: Secondary | ICD-10-CM

## 2019-03-04 DIAGNOSIS — G894 Chronic pain syndrome: Secondary | ICD-10-CM

## 2019-03-06 NOTE — Telephone Encounter (Signed)
Dr. Alysia Penna [phone]

## 2019-03-06 NOTE — Telephone Encounter (Signed)
Last OV 11/25/2018 Last refill 11/25/2018 #120/2 Next OV not scheduled  Forwarding to Dr. Berline Chough.

## 2019-03-06 NOTE — Telephone Encounter (Signed)
Will need virtual visit to discuss ongoing chronic pain issues.  Has been hospitalized pre delivery and for delivery. Will need to obtain records from [facility] regarding treatment  PDMP reviewed with concerning fill patterns.  Recent Pregnancy and delivery however Has filled all 3 Tramadol Rx for 120 tablets (90 day supply)  With additional   Tramadol #60 from Dr. Alysia Penna  Oxycodone 5mg  #140 starting on 11/24/2018 from multiple OB providers Additional concerns are multiple filling pharmacies for the different controlled medications.

## 2019-03-07 NOTE — Telephone Encounter (Signed)
Called OBGYN, currently at lunch until 2:00 PM.

## 2019-03-10 ENCOUNTER — Telehealth: Payer: Self-pay | Admitting: Sports Medicine

## 2019-03-10 NOTE — Telephone Encounter (Signed)
Pt returned call and spoke with [facility]. Judeth Cornfield advised pt that she needs to have records from [facility] and hospital sent to Dr. Berline Chough and then schedule virtual visit.

## 2019-03-10 NOTE — Telephone Encounter (Signed)
Pt called in returning a phone call to [facility] about scheduling an appt.  Per Gearldine Bienenstock and [employer], we need records sent to [place].  After we have records, we can schedule an appt.  She voiced understanding and said she will have records sent to [place] this week.

## 2019-03-10 NOTE — Telephone Encounter (Signed)
Called pt and left VM to call the office.  

## 2019-04-16 ENCOUNTER — Other Ambulatory Visit: Payer: Self-pay | Admitting: Sports Medicine

## 2019-04-16 DIAGNOSIS — Z5181 Encounter for therapeutic drug level monitoring: Secondary | ICD-10-CM

## 2019-04-16 DIAGNOSIS — G894 Chronic pain syndrome: Secondary | ICD-10-CM

## 2019-06-15 ENCOUNTER — Other Ambulatory Visit: Payer: Self-pay | Admitting: Sports Medicine

## 2019-06-15 DIAGNOSIS — G894 Chronic pain syndrome: Secondary | ICD-10-CM

## 2019-06-15 DIAGNOSIS — Z5181 Encounter for therapeutic drug level monitoring: Secondary | ICD-10-CM

## 2019-06-21 NOTE — Telephone Encounter (Signed)
Called and LM for pt regarding refill request for Gabapentin.  Asked pt to return call and let us know if she does/does not need this refill.

## 2019-06-30 NOTE — Telephone Encounter (Signed)
Called and spoke to pt regarding Gabapentin refill.  She states that she will need that refilled.  She notes that her OBGYN provider has refilled her Ultram and I advise her to reach out to that provider to see if he/she would refill the Gabapentin.  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  I advise her that that would be even more reason to see if her OBGYN provider would be able to refill the Gabapenin given the fact that Dr. Paulla Fore is no longer working for [employer].  I ask her to contact us back after speaking w/ her OBGYN's office to inform of [place] that determination.  I also tell her that Dr. Tamala Julian at the [facility] office would be an option but that she would need to schedule an appt w/ him at a later date due to his schedule being booked until mid-September.  Pt verbalizes understanding.

## 2019-07-13 NOTE — Telephone Encounter (Signed)
This call is referring to a refill request we received for Gabapentin.  Did not hear back from pt in a timely manner so I denied the refill.  Please contact pt to discuss how to proceed.

## 2019-07-13 NOTE — Telephone Encounter (Signed)
Pt called in to speak with [facility]. Pt says that she is unable to get her other provider to write Rx. Pt would like a call back for further assistance.

## 2019-10-16 NOTE — Telephone Encounter (Signed)
See note

## 2019-10-16 NOTE — Telephone Encounter (Addendum)
Pt is calling and need to know how she can get a refill on gabapentin and ultram. Pt is still having trouble with her left foot. Pt is ok with seeing dr Amalia Hailey

## 2019-10-23 NOTE — Telephone Encounter (Signed)
Called and informed Mary Mccullough that Dr. Georgina Snell would be happy to see her either virtually or in-office and would be happy to refill her Gabapentin but that he will not refill her Ultram.  Mary Mccullough states that she will think about how she wants to proceed and call us back to schedule a f/u if she chooses to.

## 2019-10-26 ENCOUNTER — Ambulatory Visit (INDEPENDENT_AMBULATORY_CARE_PROVIDER_SITE_OTHER): Payer: BC Managed Care – PPO | Admitting: Family Medicine

## 2019-10-26 ENCOUNTER — Encounter: Payer: Self-pay | Admitting: Family Medicine

## 2019-10-26 VITALS — Ht 65.0 in | Wt 247.0 lb

## 2019-10-26 DIAGNOSIS — M7989 Other specified soft tissue disorders: Secondary | ICD-10-CM

## 2019-10-26 DIAGNOSIS — M722 Plantar fascial fibromatosis: Secondary | ICD-10-CM | POA: Diagnosis not present

## 2019-10-26 DIAGNOSIS — Z5181 Encounter for therapeutic drug level monitoring: Secondary | ICD-10-CM | POA: Diagnosis not present

## 2019-10-26 DIAGNOSIS — G894 Chronic pain syndrome: Secondary | ICD-10-CM | POA: Diagnosis not present

## 2019-10-26 MED ORDER — GABAPENTIN 400 MG PO CAPS: ORAL_CAPSULE | ORAL | 2 refills | Status: DC

## 2019-10-26 NOTE — Progress Notes (Signed)
Virtual Visit  via Video Note  I connected with      Mary Mccullough by a video enabled telemedicine application and verified that I am speaking with the correct person using two identifiers.   I discussed the limitations of evaluation and management by telemedicine and the availability of in person appointments. The patient expressed understanding and agreed to proceed.  History of Present Illness: Mary Mccullough is a 33 y.o. female who would like to discuss her L foot.  Pt states that her foot swells and will get cold at times.  She notes that all these symptoms stem from an ankle injury she suffered while in college.  She rates her pain at a 2/10 currently and a 7-8/10 at it's worst.  She states that her ankle will feel stiff in the morning and then the swelling increases throughout the day.  She notes that the pain in her foot will radiate up into her L leg and thigh as the week progresses.  She has been using a frozen bottle and taking a combination of Ultram and Gabapentin.      Observations/Objective: Ht 5\' 5"  (1.651 m)   Wt 247 lb (112 kg)   BMI 41.10 kg/m  Wt Readings from Last 5 Encounters:  10/26/19 247 lb (112 kg)  11/25/18 269 lb (122 kg)  09/12/18 264 lb 6.4 oz (119.9 kg)  06/15/18 259 lb 3.2 oz (117.6 kg)  03/21/18 264 lb 6.4 oz (119.9 kg)   Exam: Appearance Normal Speech  Left leg swelling as below.              Lab and Radiology Results Three-phase bone scan from 2018 left leg normal and nerve conduction study 2017 resulted as normal reviewed.  Assessment and Plan: 33 y.o. female with  Left leg pain and swelling.  Etiology remains somewhat unclear.  It is likely that she has a component of plantar fasciitis as previously described by Dr. Paulla Fore however I do not have a good explanation for her leg swelling.  I think it is possible that she has a vascular issue and will proceed with vascular ultrasound to assess for DVT and if normal followed by venous  reflux study.  We will go ahead and restart gabapentin as that was helpful in the past and refer to pain management for continue tramadol if needed.  Recheck in person in a few weeks.  Return sooner if needed.  Precautions reviewed.  PDMP reviewed during this encounter. Orders Placed This Encounter  Procedures  . US Venous Img Lower Bilateral    Standing Status:   Future    Standing Expiration Date:   12/26/2020    Scheduling Instructions:     Pt lives in [place]. Please use local organization if able.      Send results to Lynne Leader, MD fax [phone] and fax [phone]    Order Specific Question:   Reason for Exam (SYMPTOM  OR DIAGNOSIS REQUIRED)    Answer:   Eval leg swelling for DVT    Order Specific Question:   Preferred imaging location?    Answer:   External  . Ambulatory referral to Pain Clinic    Referral Priority:   Routine    Referral Type:   Consultation    Referral Reason:   Specialty Services Required    Requested Specialty:   Pain Medicine    Number of Visits Requested:   1   Meds ordered this encounter  Medications  .  gabapentin (NEURONTIN) 400 MG capsule    Sig: TAKE ONE CAPSULE BY MOUTH TWICE DAILY AS NEEDED AND 1 EVERY NIGHT AT BEDTIME    Dispense:  90 capsule    Refill:  2    Follow Up Instructions:    I discussed the assessment and treatment plan with the patient. The patient was provided an opportunity to ask questions and all were answered. The patient agreed with the plan and demonstrated an understanding of the instructions.   The patient was advised to call back or seek an in-person evaluation if the symptoms worsen or if the condition fails to improve as anticipated.  Time: 25 minutes of intraservice time, with >39 minutes of total time during today's visit.      Historical information moved to improve visibility of documentation.  Medical history significant for neurogenic bladder and chronic pain syndrome plantar fasciitis.  Social  History   Tobacco Use  . Smoking status: Never Smoker  . Smokeless tobacco: Never Used  Substance Use Topics  . Alcohol use: Yes    Comment: very rarely   Family history is unknown by patient.  Medications: Current Outpatient Medications  Medication Sig Dispense Refill  . levETIRAcetam (KEPPRA) 1000 MG tablet Take 1,000 mg by mouth daily. At night    . diphenhydrAMINE (BENADRYL) 25 mg capsule Take by mouth as needed.     . gabapentin (NEURONTIN) 400 MG capsule TAKE ONE CAPSULE BY MOUTH TWICE DAILY AS NEEDED AND 1 EVERY NIGHT AT BEDTIME 90 capsule 2  . HYDROcodone-acetaminophen (NORCO/VICODIN) 5-325 MG tablet TK 1 T PO Q 4 H PRN    . metoCLOPramide (REGLAN) 5 MG tablet Take 5 mg by mouth as needed for nausea.    . ranitidine (ZANTAC) 75 MG tablet Take 75 mg by mouth 2 (two) times daily.    . traMADol (ULTRAM) 50 MG tablet Take 1 tablet (50 mg total) by mouth every 6 (six) hours as needed. (Patient not taking: Reported on 10/26/2019) 120 tablet 2   No current facility-administered medications for this visit.    Allergies  Allergen Reactions  . Ibuprofen-Oxycodone [Oxycodone-Ibuprofen]   . Rocephin [Ceftriaxone Sodium In Dextrose]   . Aspirin Rash and Swelling    Under eyes  . Nsaids Rash and Swelling    Under eyes

## 2019-10-30 ENCOUNTER — Encounter: Payer: Self-pay | Admitting: Family Medicine

## 2019-10-31 ENCOUNTER — Encounter: Payer: Self-pay | Admitting: Family Medicine

## 2019-10-31 DIAGNOSIS — G894 Chronic pain syndrome: Secondary | ICD-10-CM

## 2019-10-31 DIAGNOSIS — Z5181 Encounter for therapeutic drug level monitoring: Secondary | ICD-10-CM

## 2019-10-31 MED ORDER — GABAPENTIN 300 MG PO CAPS: ORAL_CAPSULE | ORAL | 3 refills | Status: DC

## 2019-12-07 ENCOUNTER — Telehealth: Payer: Self-pay | Admitting: *Deleted

## 2019-12-07 NOTE — Telephone Encounter (Signed)
Pt called stating she saw Dr. Denyse Amass 10/2019 and she thought she was supposed to have some tests done but she wasn't sure what that was. She also stated that needs a refill on the Tramadol. She has been exposed to covid at work so she is unable to come into for an OV.

## 2019-12-07 NOTE — Telephone Encounter (Signed)
I can refill the gabapentin.  It looks like you have other doctors that are prescribing tramadol.  I ordered a blood flow test to be done in [place].  It looks like that is going to be hard to arrange and I can order it for [place] or [facility] if you would like.  Let me know if you would like me to do.  I am happy to refill gabapentin if needed.

## 2019-12-07 NOTE — Telephone Encounter (Signed)
Called and LM for pt that Dr. Denyse Amass is happy to refill Gabapentin if she needs but will not refill Tramadol.  He ordered a blood flow study to be done in [place] which, since this has not been completed yet, he is happy to order to a facility in [place] or [place].  Pt is to either return call or send MyChart message informing us if she would like Gabapentin refilled and if she wants [place] to re-order blood flow study to either [place] or [place].

## 2020-04-24 ENCOUNTER — Ambulatory Visit: Payer: BC Managed Care – PPO | Admitting: Physician Assistant

## 2020-04-24 DIAGNOSIS — Z0289 Encounter for other administrative examinations: Secondary | ICD-10-CM

## 2020-07-22 ENCOUNTER — Ambulatory Visit (INDEPENDENT_AMBULATORY_CARE_PROVIDER_SITE_OTHER): Payer: BC Managed Care – PPO | Admitting: Physician Assistant

## 2020-07-22 ENCOUNTER — Encounter: Payer: Self-pay | Admitting: Physician Assistant

## 2020-07-22 ENCOUNTER — Other Ambulatory Visit: Payer: Self-pay

## 2020-07-22 VITALS — BP 120/80 | HR 107 | Temp 98.5°F | Ht 64.0 in | Wt 271.0 lb

## 2020-07-22 DIAGNOSIS — M7989 Other specified soft tissue disorders: Secondary | ICD-10-CM

## 2020-07-22 DIAGNOSIS — M722 Plantar fascial fibromatosis: Secondary | ICD-10-CM

## 2020-07-22 DIAGNOSIS — G894 Chronic pain syndrome: Secondary | ICD-10-CM | POA: Diagnosis not present

## 2020-07-22 MED ORDER — HYDROCODONE-ACETAMINOPHEN 5-325 MG PO TABS: 1.0000 | ORAL_TABLET | Freq: Four times a day (QID) | ORAL | 0 refills | Status: AC | PRN

## 2020-07-22 MED ORDER — GABAPENTIN 300 MG PO CAPS: 300.0000 mg | ORAL_CAPSULE | Freq: Three times a day (TID) | ORAL | 0 refills | Status: AC

## 2020-07-22 NOTE — Patient Instructions (Signed)
It was great to see you!  Referral to Dr. Berline Chough and Pain Management has been placed -- someone will be in touch with you soon.  I have given you a one time script for Norco as needed for severe pain. Further scripts need to come from pain management.  Restart gabapentin.  Take care,  Jarold Motto PA-C

## 2020-07-22 NOTE — Progress Notes (Signed)
Mary Mccullough is a 34 y.o. female is here to establish care and discuss left foot pain.  I acted as a Neurosurgeon for [employer], PA-C Mary Mull, LPN   History of Present Illness:   Chief Complaint  Patient presents with   Establish Care   Foot Pain    HPI   Foot pain At age 26 y/o she dislocated her left ankle. Since that time she has had ongoing issues with her L ankle.  She was seeing Dr. Gaspar Mccullough in our office in 2019-2020 being treated for her chronic L leg and foot pain.  She recently had a flare of her left foot pain since last Wednesday, went to ER back at home. Had x-rays and test for DVT was negative. Pt c/o swelling in left foot and pain rating 10/10. Pt has been taking Tramadol for pain does not really help much, given Norco at ED helped with pain, finished the Rx.     Health Maintenance Due  Topic Date Due   Hepatitis C Screening  Never done   COVID-19 Vaccine (1) Never done   HIV Screening  Never done   PAP SMEAR-Modifier  Never done   INFLUENZA VACCINE  06/23/2020    Past Medical History:  Diagnosis Date   Seizures (HCC)      Social History   Tobacco Use   Smoking status: Never Smoker   Smokeless tobacco: Never Used  Vaping Use   Vaping Use: Never used  Substance Use Topics   Alcohol use: Yes    Comment: very rarely   Drug use: Never    History reviewed. No pertinent surgical history.  Family History  Family history unknown: Yes    PMHx, SurgHx, SocialHx, FamHx, Medications, and Allergies were reviewed in the Visit Navigator and updated as appropriate.   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   Encounter for therapeutic drug monitoring 10/13/2017   Chronic pain syndrome 01/27/2017   Neurogenic dysfunction of the urinary bladder 07/11/2016   Plantar fasciitis 07/11/2016   Urinary retention with  incomplete bladder emptying 11/19/2011    Social History   Tobacco Use   Smoking status: Never Smoker   Smokeless tobacco: Never Used  Vaping Use   Vaping Use: Never used  Substance Use Topics   Alcohol use: Yes    Comment: very rarely   Drug use: Never    Current Medications and Allergies:    Current Outpatient Medications:    traMADol (ULTRAM) 50 MG tablet, Take 1 tablet (50 mg total) by mouth every 6 (six) hours as needed., Disp: 120 tablet, Rfl: 2   gabapentin (NEURONTIN) 300 MG capsule, Take 1 capsule (300 mg total) by mouth 3 (three) times daily., Disp: 270 capsule, Rfl: 0   HYDROcodone-acetaminophen (NORCO/VICODIN) 5-325 MG tablet, Take 1 tablet by mouth every 6 (six) hours as needed for moderate pain., Disp: 12 tablet, Rfl: 0   Allergies  Allergen Reactions   Benzonatate    Guaifenesin Itching   Ibuprofen-Oxycodone [Oxycodone-Ibuprofen]    Promethazine Itching   Rocephin [Ceftriaxone Sodium In Dextrose]    Vancomycin Other (See Comments)    Possible redman reaction 5/10 at [place]    Aspirin Rash and Swelling    Under eyes   Nsaids Rash and Swelling    Under eyes    Review of Systems   ROS  Negative unless otherwise  specified per HPI.  Vitals:   Vitals:   07/22/20 1320  BP: 120/80  Pulse: (!) 107  Temp: 98.5 F (36.9 C)  TempSrc: Temporal  SpO2: 99%  Weight: 271 lb (122.9 kg)  Height: 5\' 4"  (1.626 m)     Body mass index is 46.52 kg/m.   Physical Exam:    Physical Exam Vitals and nursing note reviewed.  Constitutional:      General: She is not in acute distress.    Appearance: She is well-developed. She is not ill-appearing or toxic-appearing.  Cardiovascular:     Rate and Rhythm: Normal rate and regular rhythm.     Pulses: Normal pulses.          Dorsalis pedis pulses are 2+ on the right side and 2+ on the left side.       Posterior tibial pulses are 2+ on the right side and 2+ on the left side.     Heart sounds:  Normal heart sounds, S1 normal and S2 normal.  Pulmonary:     Effort: Pulmonary effort is normal.     Breath sounds: Normal breath sounds.  Feet:     Comments: L foot with swelling and tenderness to top of foot Skin:    General: Skin is warm and dry.  Neurological:     Mental Status: She is alert.     GCS: GCS eye subscore is 4. GCS verbal subscore is 5. GCS motor subscore is 6.  Psychiatric:        Speech: Speech normal.        Behavior: Behavior normal. Behavior is cooperative.      Assessment and Plan:    Mary Mccullough was seen today for establish care and foot pain.  Diagnoses and all orders for this visit:  Chronic pain syndrome; Leg swelling; Plantar fasciitis Chronic issue. Referral to Pain Clinic and Dr. Aundra Millet for further evaluation and management. One time script of 15 tabs of Norco given today. Further refills must come from alternative provider (pain mgmt.) Restart gabapentin. -     Ambulatory referral to Pain Clinic -     Ambulatory referral to Sports Medicine  Other orders -     gabapentin (NEURONTIN) 300 MG capsule; Take 1 capsule (300 mg total) by mouth 3 (three) times daily. -     HYDROcodone-acetaminophen (NORCO/VICODIN) 5-325 MG tablet; Take 1 tablet by mouth every 6 (six) hours as needed for moderate pain.   Reviewed expectations re: course of current medical issues.  Discussed self-management of symptoms.  Outlined signs and symptoms indicating need for more acute intervention.  Patient verbalized understanding and all questions were answered.  See orders for this visit as documented in the electronic medical record.  Patient received an After Visit Summary.  CMA or LPN served as scribe during this visit. History, Physical, and Plan performed by medical provider. The above documentation has been reviewed and is accurate and complete.  Mary Chough, PA-C [place] [place] 07/22/2020  Follow-up: No follow-ups on file.

## 2020-07-26 ENCOUNTER — Telehealth: Payer: Self-pay

## 2020-07-26 NOTE — Telephone Encounter (Signed)
Shanda Bumps, please call pt and offer her an appointment with another provider today or go to Urgent Care, or she can call Sports Medicine and let them know she is having pain and see if they will see her sooner. Lelon Mast is out of the office.

## 2020-07-26 NOTE — Telephone Encounter (Signed)
Pt.'s foot is in severe pain and appointment for Pain management isn't for 6 weeks. She is begging for help of what she can do.

## 2020-07-26 NOTE — Telephone Encounter (Signed)
Relayed pt message

## 2020-08-01 NOTE — Telephone Encounter (Signed)
Pt called stating Sports Medicine is booked out for a month. Pt states she is still in a lot of pain and her foot/leg is swollen. Pt states she is taking the gabapentin as prescribed by Lelon Mast. She has been told to call us back and see if there is anything else we can do in the mean time while waiting for her appointment with Pain Management. Please advise.

## 2020-08-01 NOTE — Telephone Encounter (Signed)
Spoke to pt told her she needs to contact Sports Medicine, they called you twice and left message. Pt said yes, she did call and then she called Dr. Janeece Riggers office and they can see her so pt is scheduled for Sept 17th to see him. Told her okay good, and I see you have an appt with pain management for Oct 7th. Pt said yes, will see what Dr. Berline Chough can do being as I saw him before and go from there. Told pt okay have a good day.

## 2020-08-29 ENCOUNTER — Encounter: Payer: BC Managed Care – PPO | Admitting: Physical Medicine and Rehabilitation

## 2020-09-23 ENCOUNTER — Telehealth: Payer: Self-pay

## 2020-09-23 NOTE — Telephone Encounter (Signed)
error 

## 2020-09-26 ENCOUNTER — Encounter: Payer: BC Managed Care – PPO | Admitting: Physical Medicine and Rehabilitation

## 2020-09-27 ENCOUNTER — Telehealth: Payer: Self-pay

## 2020-09-27 NOTE — Telephone Encounter (Signed)
[facility] is calling asking for more notes on the patient and demographics before they make a discission on seeing her.

## 2020-10-01 NOTE — Telephone Encounter (Signed)
Patient had already been contacted for scheduling

## 2020-10-29 ENCOUNTER — Telehealth: Payer: Self-pay

## 2020-10-29 NOTE — Telephone Encounter (Signed)
error 

## 2022-08-17 ENCOUNTER — Encounter: Payer: Self-pay | Admitting: *Deleted

## 2022-11-05 ENCOUNTER — Encounter: Payer: Self-pay | Admitting: *Deleted
